# Patient Record
Sex: Male | Born: 1950
Health system: Southern US, Community
[De-identification: ages and names within clinical notes are randomized; demographics above are authoritative.]

## PROBLEM LIST (undated history)

## (undated) DIAGNOSIS — M1990 Unspecified osteoarthritis, unspecified site: Secondary | ICD-10-CM

## (undated) DIAGNOSIS — I10 Essential (primary) hypertension: Secondary | ICD-10-CM

## (undated) DIAGNOSIS — K219 Gastro-esophageal reflux disease without esophagitis: Secondary | ICD-10-CM

## (undated) DIAGNOSIS — E785 Hyperlipidemia, unspecified: Secondary | ICD-10-CM

## (undated) HISTORY — PX: LASIK: SHX215

## (undated) HISTORY — PX: TONSILLECTOMY: SUR1361

## (undated) HISTORY — DX: Essential (primary) hypertension: I10

## (undated) HISTORY — DX: Hyperlipidemia, unspecified: E78.5

## (undated) HISTORY — PX: APPENDECTOMY: SHX54

## (undated) HISTORY — DX: Gastro-esophageal reflux disease without esophagitis: K21.9

## (undated) HISTORY — PX: HEMORRHOID SURGERY: SHX153

---

## 1999-05-24 ENCOUNTER — Emergency Department (HOSPITAL_COMMUNITY): Admission: EM | Admit: 1999-05-24 | Discharge: 1999-05-25 | Payer: Self-pay | Admitting: Emergency Medicine

## 1999-10-02 ENCOUNTER — Emergency Department (HOSPITAL_COMMUNITY): Admission: EM | Admit: 1999-10-02 | Discharge: 1999-10-02 | Payer: Self-pay | Admitting: Emergency Medicine

## 2000-05-10 ENCOUNTER — Emergency Department (HOSPITAL_COMMUNITY): Admission: EM | Admit: 2000-05-10 | Discharge: 2000-05-10 | Payer: Self-pay | Admitting: Emergency Medicine

## 2001-10-10 ENCOUNTER — Emergency Department (HOSPITAL_COMMUNITY): Admission: EM | Admit: 2001-10-10 | Discharge: 2001-10-10 | Payer: Self-pay | Admitting: Emergency Medicine

## 2001-11-14 ENCOUNTER — Emergency Department (HOSPITAL_COMMUNITY): Admission: EM | Admit: 2001-11-14 | Discharge: 2001-11-14 | Payer: Self-pay | Admitting: Emergency Medicine

## 2002-07-02 ENCOUNTER — Emergency Department (HOSPITAL_COMMUNITY): Admission: EM | Admit: 2002-07-02 | Discharge: 2002-07-02 | Payer: Self-pay | Admitting: Emergency Medicine

## 2002-07-05 ENCOUNTER — Emergency Department (HOSPITAL_COMMUNITY): Admission: EM | Admit: 2002-07-05 | Discharge: 2002-07-06 | Payer: Self-pay | Admitting: Emergency Medicine

## 2004-03-15 ENCOUNTER — Emergency Department (HOSPITAL_COMMUNITY): Admission: EM | Admit: 2004-03-15 | Discharge: 2004-03-15 | Payer: Self-pay | Admitting: Emergency Medicine

## 2005-06-13 ENCOUNTER — Inpatient Hospital Stay (HOSPITAL_COMMUNITY): Admission: AD | Admit: 2005-06-13 | Discharge: 2005-06-15 | Payer: Self-pay | Admitting: Internal Medicine

## 2005-06-13 ENCOUNTER — Encounter: Payer: Self-pay | Admitting: Internal Medicine

## 2007-10-12 ENCOUNTER — Emergency Department (HOSPITAL_COMMUNITY): Admission: EM | Admit: 2007-10-12 | Discharge: 2007-10-12 | Payer: Self-pay | Admitting: *Deleted

## 2008-08-05 ENCOUNTER — Emergency Department (HOSPITAL_COMMUNITY): Admission: EM | Admit: 2008-08-05 | Discharge: 2008-08-05 | Payer: Self-pay | Admitting: Emergency Medicine

## 2010-04-28 ENCOUNTER — Emergency Department (HOSPITAL_COMMUNITY): Admission: EM | Admit: 2010-04-28 | Discharge: 2010-04-28 | Payer: Self-pay | Admitting: Emergency Medicine

## 2010-10-02 ENCOUNTER — Emergency Department (HOSPITAL_COMMUNITY): Admission: EM | Admit: 2010-10-02 | Discharge: 2010-10-02 | Payer: Self-pay | Admitting: Emergency Medicine

## 2010-12-04 ENCOUNTER — Emergency Department (HOSPITAL_COMMUNITY)
Admission: EM | Admit: 2010-12-04 | Discharge: 2010-12-04 | Payer: Self-pay | Source: Home / Self Care | Admitting: Emergency Medicine

## 2011-01-04 ENCOUNTER — Telehealth: Payer: Self-pay | Admitting: *Deleted

## 2011-01-04 NOTE — Telephone Encounter (Signed)
Error in opening encounter

## 2011-01-25 ENCOUNTER — Emergency Department (HOSPITAL_COMMUNITY)
Admission: EM | Admit: 2011-01-25 | Discharge: 2011-01-25 | Disposition: A | Discharge status: Home / Self Care | Payer: Managed Care, Other (non HMO) | Attending: Emergency Medicine | Admitting: Emergency Medicine

## 2011-01-25 DIAGNOSIS — Z79899 Other long term (current) drug therapy: Secondary | ICD-10-CM | POA: Insufficient documentation

## 2011-01-25 DIAGNOSIS — K2970 Gastritis, unspecified, without bleeding: Secondary | ICD-10-CM | POA: Insufficient documentation

## 2011-01-25 DIAGNOSIS — R10819 Abdominal tenderness, unspecified site: Secondary | ICD-10-CM | POA: Insufficient documentation

## 2011-01-25 DIAGNOSIS — E119 Type 2 diabetes mellitus without complications: Secondary | ICD-10-CM | POA: Insufficient documentation

## 2011-01-25 DIAGNOSIS — R1033 Periumbilical pain: Secondary | ICD-10-CM | POA: Insufficient documentation

## 2011-01-25 DIAGNOSIS — E7800 Pure hypercholesterolemia, unspecified: Secondary | ICD-10-CM | POA: Insufficient documentation

## 2011-01-25 DIAGNOSIS — I10 Essential (primary) hypertension: Secondary | ICD-10-CM | POA: Insufficient documentation

## 2011-01-25 DIAGNOSIS — Z9889 Other specified postprocedural states: Secondary | ICD-10-CM | POA: Insufficient documentation

## 2011-01-25 LAB — COMPREHENSIVE METABOLIC PANEL
ALT: 23 U/L (ref 0–53)
Albumin: 4.3 g/dL (ref 3.5–5.2)
Alkaline Phosphatase: 61 U/L (ref 39–117)
GFR calc Af Amer: >60 mL/min (ref >60)
GFR calc non Af Amer: >60 mL/min (ref >60)
Glucose, Bld: 162 mg/dL — ABNORMAL HIGH (ref 70–99)
Potassium: 4.5 mEq/L (ref 3.5–5.1)
Sodium: 136 mEq/L (ref 135–145)
Total Bilirubin: 1.1 mg/dL (ref 0.3–1.2)
Total Protein: 7.9 g/dL (ref 6.0–8.3)

## 2011-01-25 LAB — CBC
Hemoglobin: 14.3 g/dL (ref 13.0–17.0)
MCV: 70.9 fL — ABNORMAL LOW (ref 78.0–100.0)
RBC: 6.23 MIL/uL — ABNORMAL HIGH (ref 4.22–5.81)
RDW: 14.2 % (ref 11.5–15.5)

## 2011-01-25 LAB — POCT CARDIAC MARKERS
Myoglobin, poc: 86.5 ng/mL (ref 12–200)
Troponin i, poc: <0.05 ng/mL (ref 0.00–0.09)

## 2011-01-25 LAB — DIFFERENTIAL
Basophils Absolute: 0 10*3/uL (ref 0.0–0.1)
Basophils Relative: 0 % (ref 0–1)
Eosinophils Absolute: 0 10*3/uL (ref 0.0–0.7)
Eosinophils Relative: 0 % (ref 0–5)
Monocytes Absolute: 0.7 10*3/uL (ref 0.1–1.0)
Neutro Abs: 7.1 10*3/uL (ref 1.7–7.7)
Neutrophils Relative %: 76 % (ref 43–77)

## 2011-01-26 ENCOUNTER — Inpatient Hospital Stay (HOSPITAL_COMMUNITY)
Admission: EM | Admit: 2011-01-26 | Discharge: 2011-01-28 | DRG: 392 | Disposition: A | Discharge status: Home / Self Care | Payer: Managed Care, Other (non HMO) | Attending: Family Medicine | Admitting: Family Medicine

## 2011-01-26 DIAGNOSIS — R1033 Periumbilical pain: Secondary | ICD-10-CM | POA: Diagnosis present

## 2011-01-26 DIAGNOSIS — I10 Essential (primary) hypertension: Secondary | ICD-10-CM | POA: Diagnosis present

## 2011-01-26 DIAGNOSIS — E119 Type 2 diabetes mellitus without complications: Secondary | ICD-10-CM | POA: Diagnosis present

## 2011-01-26 DIAGNOSIS — A09 Infectious gastroenteritis and colitis, unspecified: Principal | ICD-10-CM | POA: Diagnosis present

## 2011-01-27 ENCOUNTER — Emergency Department (HOSPITAL_COMMUNITY): Payer: Managed Care, Other (non HMO)

## 2011-01-27 DIAGNOSIS — R933 Abnormal findings on diagnostic imaging of other parts of digestive tract: Secondary | ICD-10-CM

## 2011-01-27 DIAGNOSIS — R109 Unspecified abdominal pain: Secondary | ICD-10-CM

## 2011-01-27 LAB — COMPREHENSIVE METABOLIC PANEL
ALT: 18 U/L (ref 0–53)
ALT: 21 U/L (ref 0–53)
Alkaline Phosphatase: 53 U/L (ref 39–117)
BUN: 13 mg/dL (ref 6–23)
CO2: 27 mEq/L (ref 19–32)
Creatinine, Ser: 1.02 mg/dL (ref 0.4–1.5)
GFR calc Af Amer: >60 mL/min (ref >60)
GFR calc non Af Amer: >60 mL/min (ref >60)
Glucose, Bld: 107 mg/dL — ABNORMAL HIGH (ref 70–99)
Potassium: 4.7 mEq/L (ref 3.5–5.1)
Potassium: 5.1 mEq/L (ref 3.5–5.1)
Sodium: 138 mEq/L (ref 135–145)
Sodium: 137 mEq/L (ref 135–145)
Total Bilirubin: 1.2 mg/dL (ref 0.3–1.2)
Total Protein: 6.4 g/dL (ref 6.0–8.3)

## 2011-01-27 LAB — DIFFERENTIAL
Basophils Absolute: 0 10*3/uL (ref 0.0–0.1)
Basophils Relative: 0 % (ref 0–1)
Eosinophils Absolute: 0.2 10*3/uL (ref 0.0–0.7)
Eosinophils Relative: 3 % (ref 0–5)
Lymphocytes Relative: 25 % (ref 12–46)
Lymphs Abs: 2 10*3/uL (ref 0.7–4.0)
Lymphs Abs: 1.6 10*3/uL (ref 0.7–4.0)
Monocytes Absolute: 0.7 10*3/uL (ref 0.1–1.0)
Monocytes Absolute: 0.5 10*3/uL (ref 0.1–1.0)
Neutro Abs: 4 10*3/uL (ref 1.7–7.7)
Neutro Abs: 4.1 10*3/uL (ref 1.7–7.7)
Neutrophils Relative %: 65 % (ref 43–77)

## 2011-01-27 LAB — TSH: TSH: 1.447 u[IU]/mL (ref 0.350–4.500)

## 2011-01-27 LAB — CBC
HCT: 43.1 % (ref 39.0–52.0)
HCT: 40.9 % (ref 39.0–52.0)
Hemoglobin: 13.3 g/dL (ref 13.0–17.0)
MCH: 22.7 pg — ABNORMAL LOW (ref 26.0–34.0)
MCHC: 30.9 g/dL (ref 30.0–36.0)
Platelets: 239 10*3/uL (ref 150–400)
RBC: 5.85 MIL/uL — ABNORMAL HIGH (ref 4.22–5.81)
RDW: 14.3 % (ref 11.5–15.5)

## 2011-01-27 LAB — GLUCOSE, CAPILLARY
Glucose-Capillary: 137 mg/dL — ABNORMAL HIGH (ref 70–99)
Glucose-Capillary: 96 mg/dL (ref 70–99)

## 2011-01-27 LAB — LACTIC ACID, PLASMA: Lactic Acid, Venous: 1.8 mmol/L (ref 0.5–2.2)

## 2011-01-27 LAB — HEMOGLOBIN A1C
Hgb A1c MFr Bld: 6.4 % — ABNORMAL HIGH (ref <5.7)
Mean Plasma Glucose: 137 mg/dL — ABNORMAL HIGH (ref <117)

## 2011-01-27 MED ORDER — IOHEXOL 300 MG/ML  SOLN
125.0000 mL | Freq: Once | INTRAMUSCULAR | Status: AC | PRN
  Administered 2011-01-27: 125 mL via INTRAVENOUS

## 2011-01-28 ENCOUNTER — Telehealth: Payer: Self-pay | Admitting: Internal Medicine

## 2011-01-28 LAB — COMPREHENSIVE METABOLIC PANEL
Albumin: 3.2 g/dL — ABNORMAL LOW (ref 3.5–5.2)
Alkaline Phosphatase: 46 U/L (ref 39–117)
BUN: 10 mg/dL (ref 6–23)
Calcium: 8.1 mg/dL — ABNORMAL LOW (ref 8.4–10.5)
Glucose, Bld: 138 mg/dL — ABNORMAL HIGH (ref 70–99)
Potassium: 4.5 mEq/L (ref 3.5–5.1)
Total Protein: 6.2 g/dL (ref 6.0–8.3)

## 2011-01-28 LAB — CBC
MCH: 23 pg — ABNORMAL LOW (ref 26.0–34.0)
MCV: 74.3 fL — ABNORMAL LOW (ref 78.0–100.0)
Platelets: 242 10*3/uL (ref 150–400)
RBC: 5.26 MIL/uL (ref 4.22–5.81)
RDW: 14.5 % (ref 11.5–15.5)
WBC: 7.6 10*3/uL (ref 4.0–10.5)

## 2011-01-28 LAB — DIFFERENTIAL
Basophils Relative: 0 % (ref 0–1)
Eosinophils Absolute: 0.1 10*3/uL (ref 0.0–0.7)
Eosinophils Relative: 1 % (ref 0–5)
Monocytes Relative: 9 % (ref 3–12)
Neutrophils Relative %: 78 % — ABNORMAL HIGH (ref 43–77)

## 2011-01-28 LAB — IRON AND TIBC
Iron: 28 ug/dL — ABNORMAL LOW (ref 42–135)
Saturation Ratios: 10 % — ABNORMAL LOW (ref 20–55)
TIBC: 276 ug/dL (ref 215–435)

## 2011-01-28 LAB — GLUCOSE, CAPILLARY
Glucose-Capillary: 135 mg/dL — ABNORMAL HIGH (ref 70–99)
Glucose-Capillary: 156 mg/dL — ABNORMAL HIGH (ref 70–99)

## 2011-01-28 LAB — LIPASE, BLOOD: Lipase: 20 U/L (ref 11–59)

## 2011-01-28 LAB — AMYLASE: Amylase: 64 U/L (ref 0–105)

## 2011-02-02 NOTE — Discharge Summary (Signed)
James [name], [name]                ACCOUNT NO.:  [id number]  MEDICAL RECORD NO.:  [id number]           PATIENT TYPE:  I  LOCATION:  1536                         FACILITY:  [facility]  PHYSICIAN:  James Kaufmann, MD         DATE OF BIRTH:  [birth year]  DATE OF ADMISSION:  01/27/2011 DATE OF DISCHARGE:  01/28/2011                              DISCHARGE SUMMARY   ADMISSION DIAGNOSES: 1. Abdominal pain. 2. Diabetes mellitus. 3. Hypertension.  DISCHARGE DIAGNOSES: 1. Small bowel wall thickening, questionable inflammatory process. 2. Diabetes mellitus. 3. Hypertension.  Tests performed in the hospital include CT of the abdomen and pelvis on January 27, 2011, showed mid small bowel wall thickening and mucosal fold thickening consistent with inflammatory infectious process.  Distal and terminal ileum are normal and no findings for obstruction.  Stable low- attenuation liver lesion.  No abdominal mass lesions or adenopathy.  Pertinent labs in the hospital, hemoglobin A1c was 6.4.  TSH 1.447. Lactic acid was 1.8.  Lipase 22.  BUN 10, creatinine 1.25.  BRIEF HISTORY AND PHYSICAL:  A 60 year old male with a history of diabetes, hypertension, hyperlipidemia, and GERD who came with complaints of abdominal pain which was sharp and constant in the periumbilical area, which was hurting when the patient eats or drinks. The patient was admitted with diagnosis of abdominal pain, rule out inflammatory bowel disease, and GI consult was obtained.  BRIEF HOSPITAL COURSE: 1. The patient was seen by GI and the patient was started on Bentyl     for the cramping and colonoscopy report was obtained, which the     patient had in 2006, which was a normal colonoscopy.  The patient     at this time has improvement of the symptoms and is able to     tolerate the food.  Diet is advanced and as per GI, they will     follow up the patient in the office as outpatient.  At this time,     the patient can be discharged  home.  The patient has an appointment     with Dr. Leone Payor on February 22, 2011. 2. Diabetes mellitus.  The patient will continue to use the metformin. 3. Hypertension.  The patient will continue to use Micardis.  VITALS ON THE DAY OF DISCHARGE:  Blood pressure 113/78, pulse is 80, respirations 18, temperature is 98.3, O2 sats 94% on room air.  The patient had normal white count throughout the hospital stay with a white count on discharge of 7.6.  The patient also had anemia panel drawn.  The labs will be followed by Dr. Marvell Fuller office.  MEDICATIONS ON DISCHARGE: 1. Dicyclomine 10 mg p.o. t.i.d. as needed. 2. Lortab 5/500 on tablet every 8 hours as needed. 3. Aspirin enteric-coated 81 mg p.o. daily. 4. Glipizide XL 10 mg p.o. daily every morning. 5. Metformin XR 750 mg 1 tablet p.o. b.i.d. 6. Micardis 80 mg 1 tablet her p.o. daily. 7. Multivitamin 1 tablet p.o. daily. 8. Pantoprazole 40 mg p.o. daily. 9. Simvastatin 40 mg one p.o. daily.     James Kaufmann, MD  GL/MEDQ  D:  01/28/2011  T:  01/29/2011  Job:  [id number]  cc:   Iva Boop, MD,FACG Shirlean Kelly  Electronically Signed by James [name]  on 02/02/2011 12:07:01 PM

## 2011-02-05 NOTE — Progress Notes (Signed)
Summary: Medication  Phone Note Call from Patient Call back at Home Phone [phone]   Caller: Patient Call For: Mike Gip Reason for Call: Talk to Nurse Summary of Call: Pt. said Amy released him from [facility] today and was given script for Hydrocodone and he is allergic to it.Marland Kitchen..Marland Kitchenneeds an alternative med. [facility] [address] Initial call taken by: Karna Christmas,  January 28, 2011 2:52 PM  Follow-up for Phone Call        Message left for patient to call back. Spoke with pharmacy. Patient did have an rx from the hospitalist. They did not fill it. Jesse Fall, RN 01/28/11 3:27 PM Spoke with patient. He would like the rx for Ultram sent to [facility] on [address]. Rx faxed to them. Follow-up by: Jesse Fall RN,  January 28, 2011 3:56 PM    New/Updated Medications: ULTRAM 50 MG TABS (TRAMADOL HCL) Take one every 6 hours as needed pain Prescriptions: ULTRAM 50 MG TABS (TRAMADOL HCL) Take one every 6 hours as needed pain  #30 x 0   Entered by:   Jesse Fall RN   Authorized by:   Sammuel Cooper PA-c   Signed by:   Jesse Fall RN on 01/28/2011   Method used:   Printed then faxed to ...         RxID:   [id number]

## 2011-02-11 LAB — COMPREHENSIVE METABOLIC PANEL
AST: 22 U/L (ref 0–37)
Albumin: 4.1 g/dL (ref 3.5–5.2)
BUN: 8 mg/dL (ref 6–23)
Calcium: 9.6 mg/dL (ref 8.4–10.5)
Chloride: 104 mEq/L (ref 96–112)
Creatinine, Ser: 0.99 mg/dL (ref 0.4–1.5)
GFR calc Af Amer: >60 mL/min (ref >60)
Total Bilirubin: 0.9 mg/dL (ref 0.3–1.2)

## 2011-02-11 LAB — URINALYSIS, ROUTINE W REFLEX MICROSCOPIC
Bilirubin Urine: NEGATIVE
Glucose, UA: NEGATIVE mg/dL
Hgb urine dipstick: NEGATIVE
Ketones, ur: NEGATIVE mg/dL
Protein, ur: NEGATIVE mg/dL
Urobilinogen, UA: 1 mg/dL (ref 0.0–1.0)

## 2011-02-11 LAB — CBC
HCT: 41.8 % (ref 39.0–52.0)
MCHC: 31.6 g/dL (ref 30.0–36.0)
MCV: 73.6 fL — ABNORMAL LOW (ref 78.0–100.0)
Platelets: 278 10*3/uL (ref 150–400)
RDW: 14.3 % (ref 11.5–15.5)
WBC: 6.7 10*3/uL (ref 4.0–10.5)

## 2011-02-11 LAB — DIFFERENTIAL
Basophils Absolute: 0 10*3/uL (ref 0.0–0.1)
Lymphocytes Relative: 28 % (ref 12–46)
Lymphs Abs: 1.9 10*3/uL (ref 0.7–4.0)
Monocytes Absolute: 0.6 10*3/uL (ref 0.1–1.0)
Neutro Abs: 4.2 10*3/uL (ref 1.7–7.7)

## 2011-02-11 LAB — LIPASE, BLOOD: Lipase: 22 U/L (ref 11–59)

## 2011-02-22 ENCOUNTER — Ambulatory Visit (INDEPENDENT_AMBULATORY_CARE_PROVIDER_SITE_OTHER): Payer: Managed Care, Other (non HMO) | Admitting: Internal Medicine

## 2011-02-22 ENCOUNTER — Other Ambulatory Visit (INDEPENDENT_AMBULATORY_CARE_PROVIDER_SITE_OTHER): Payer: Managed Care, Other (non HMO)

## 2011-02-22 ENCOUNTER — Encounter: Payer: Self-pay | Admitting: Internal Medicine

## 2011-02-22 VITALS — BP 130/90 | HR 88 | Ht 65.0 in | Wt 186.2 lb

## 2011-02-22 DIAGNOSIS — I10 Essential (primary) hypertension: Secondary | ICD-10-CM

## 2011-02-22 DIAGNOSIS — K529 Noninfective gastroenteritis and colitis, unspecified: Secondary | ICD-10-CM

## 2011-02-22 DIAGNOSIS — D649 Anemia, unspecified: Secondary | ICD-10-CM

## 2011-02-22 DIAGNOSIS — E119 Type 2 diabetes mellitus without complications: Secondary | ICD-10-CM

## 2011-02-22 DIAGNOSIS — K5289 Other specified noninfective gastroenteritis and colitis: Secondary | ICD-10-CM

## 2011-02-22 LAB — CBC WITH DIFFERENTIAL/PLATELET
Basophils Relative: 0.3 % (ref 0.0–3.0)
Eosinophils Relative: 3.9 % (ref 0.0–5.0)
HCT: 39.2 % (ref 39.0–52.0)
Hemoglobin: 12.8 g/dL — ABNORMAL LOW (ref 13.0–17.0)
Lymphocytes Relative: 23 % (ref 12.0–46.0)
Monocytes Absolute: 0.6 10*3/uL (ref 0.1–1.0)
Monocytes Relative: 7.9 % (ref 3.0–12.0)
Neutro Abs: 4.8 10*3/uL (ref 1.4–7.7)
RBC: 5.31 Mil/uL (ref 4.22–5.81)
WBC: 7.4 10*3/uL (ref 4.5–10.5)

## 2011-02-22 LAB — FERRITIN: Ferritin: 52 ng/mL (ref 22.0–322.0)

## 2011-02-22 NOTE — Progress Notes (Signed)
60 year old [place] American man being seen in followup after hospitalization. He was admitted to the hospital in early March with nausea vomiting and diarrhea. He had a CT scan with findings as described below.  CT abd/pelvis 01/27/2011 1. Mid small bowel wall thickening and mucosal fold thickening  consistent with an inflammatory or infectious process. The distal  and terminal ileum are normal and no findings for obstruction.  2. Stable low attenuation liver lesion.  3. No abdominal mass lesions or adenopathy.  At the time the impression was of a probable gastroenteritis. He had a similar presentation in 2006 with terminal ileitis and subsequent colonoscopy by her another gastroenterologist was unrevealing at that time. He had abdominal  pain in 2011 and but a CT abdomen and pelvis was unremarkable with respect to the bowel at that time. He feels well now and notes no constipation or diarrhea. He had a mild anemia with microcytosis, his iron saturation was 10% with  TIBC of about 280.  He does not recall a prior diagnosis of anemia.  Physical exam: Well-developed well-nourished no acute distress  Eyes: anicteric Abdomen: is soft and nontender without organomegaly or mass

## 2011-02-22 NOTE — Patient Instructions (Addendum)
He will have a complete blood count, b 12 level and ferritin (iron) level checked today due to your mild anemia detected the hospital. The esophagus will call those results and any further plans. If you develop recurrent abdominal pain nausea and/or vomiting or other bowel changes, please call us back. A routine colonoscopy should be performed in 2016 as part of colon cancer screening. Please go to basement to have your labs today

## 2011-02-22 NOTE — Assessment & Plan Note (Signed)
He has had 2 episodes CT scan, once in 2006 with followup negative colonoscopy including terminal ileum inspection. Then more recently in March of this year 2012, with bowel thickening. There is thickening of the terminal ileum only prior incident. This is most likely separate episodes of a enteritis problem that was probably infectious. At this time we have agreed to observe him up for her current problems. Should he have recurrent problems I think even a capsule endoscopy of the small intestine or a CT or MR enterography would be in order

## 2011-02-22 NOTE — Assessment & Plan Note (Addendum)
Microcytic anemia of unclear etiology. There was a minimal anemia. We'll go ahead and check B12 and ferritin as his iron saturation was low but TIBC was in the mid range. Further plans pending those results. His metformin Sinemet some increased risk of B12 deficiency her that usually not microcytic. 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

## 2011-02-25 NOTE — Progress Notes (Signed)
Quick Note:  Red blood cells are small - they were 1 year ago also B12 ok, iron ok but low normal range His PCP is [facility] the clinic has a satellite at [facility] and he gets his care there Need to see what his CBC's were over past 2-3 years if they can do that - if this is newer problem may require other work-up ? If he has sickle trait or other blood cell abnormality (not aware but ask him) Also cc them on the labs, I believe I faxed the office visit already ______

## 2011-03-01 ENCOUNTER — Telehealth: Payer: Self-pay | Admitting: Internal Medicine

## 2011-03-01 NOTE — Telephone Encounter (Signed)
A patient now u his prior labs and he has small red blood cells on a chronic basis. This should not indicated any health problem as far as I can tell and needs no further workup at this time.

## 2011-03-04 ENCOUNTER — Encounter: Payer: Self-pay | Admitting: Internal Medicine

## 2011-03-04 NOTE — Telephone Encounter (Signed)
Patient advised.

## 2011-03-07 NOTE — H&P (Signed)
NAMECAMRON, [name]                ACCOUNT NO.:  [id number]  MEDICAL RECORD NO.:  [id number]           PATIENT TYPE:  E  LOCATION:  WLED                         FACILITY:  [facility]  PHYSICIAN:  Massie Maroon, MD        DATE OF BIRTH:  Apr 01, 1951  DATE OF ADMISSION:  01/26/2011 DATE OF DISCHARGE:                             HISTORY & PHYSICAL   CHIEF COMPLAINT:  My stomachaches.  HISTORY OF PRESENT ILLNESS:  A 60 year old male with a history of diabetes, hypertension, hyperlipidemia, GERD, presents with complaints of stomachache.  It is a sharp, constant pain in the periumbilical area and it hurts when he eats or drinks worse.  The patient denies any fever, chills, nausea, vomiting, bright red blood per rectum, or black stool.  He did note diarrhea x1 this morning.  He notes that he has had an endoscopy at [facility] about 5 years ago.  The patient was evaluated in the emergency room and a CT scan showed evidence of mid small bowel wall thickening and mucosal fold thickening consistent with an inflammatory infectious process.  The distal and terminal ileum are normal and no findings for obstruction.  Stable low attenuation liver lesion.  No abdominal mass.  The patient will be admitted for rule out of inflammatory bowel disease.  PAST MEDICAL HISTORY: 1. Type 2 diabetes. 2. Hypertension. 3. Hyperlipidemia. 4. GERD/gastritis. 5. Hydrocele.  PAST SURGICAL HISTORY: 1. Hemorrhoidectomy. 2. Appendectomy.  SOCIAL HISTORY:  The patient does not smoke or drink.  He is married and has 4 children.  He works at [employer].  FAMILY HISTORY:  There is no family history of any GI problems such as colon cancer, inflammatory bowel disease.  His mother died at age 33 with CHF.  His father died at age 43 with kidney failure related to diabetes.  ALLERGIES:  HYDROCODONE, PENICILLIN.  MEDICATIONS: 1. Micardis 80 mg p.o. daily. 2. Metformin 750 mg p.o. daily. 3. Glipizide. 4. Simvastatin 40 mg  p.o. q.h.s. 5. Protonix 40 mg p.o. daily.  REVIEW OF SYSTEMS:  The patient had evidence for distal ileitis and associated minimal free peritoneal fluid, infectious or inflammatory in nature, June 14, 2005, on CT scan.  Review of systems is otherwise negative for all 10 organ systems except for pertinent positives stated above.  PHYSICAL EXAMINATION:  VITAL SIGNS:  Temperature 98, pulse 75, blood pressure 131/87, pulse ox 98% on room air. HEENT:  Anicteric, EOMI, no nystagmus, pupils 1.5 mm, symmetric.  Direct consensual near reflexes intact.  Mucous membranes moist. NECK:  No JVD, no bruit, no thyromegaly, no adenopathy. HEART:  Regular rate and rhythm, S1, S2.  No murmurs, gallops, or rubs. LUNGS:  Clear to auscultation bilaterally. ABDOMEN:  Soft, nontender, nondistended.  Positive bowel sounds. EXTREMITIES:  No cyanosis, clubbing, or edema. SKIN:  No rashes. LYMPH NODES:  No adenopathy. NEUROLOGIC:  Nonfocal. GU:  Hydrocele.  LABORATORY DATA:  WBC 6.9, hemoglobin 13.3, platelet count 269, PT 13.6, INR 1.01, PTT 32, lipase 22.  Sodium 138, potassium 4.7, BUN 13, creatinine 0.98.  AST 25, ALT 18, alk phos 57, total bilirubin  1.0.  ASSESSMENT/PLAN: 1. Abdominal pain:  Rule out inflammatory bowel disease.  Please call     GI consult this morning please.  The patient will be started on     Cipro 400 mg IV b.i.d. and Flagyl 500 mg IV t.i.d. 2. Diabetes type 2:  Fingerstick blood sugars q.4 h. and transition to     a.c., h.s., NovoLog sensitive sliding scale. 3. Hypertension:  Micardis 80 mg p.o. daily.     Massie Maroon, MD     JYK/MEDQ  D:  01/27/2011  T:  01/27/2011  Job:  [id number]  cc:   Shirlean Kelly [facility]  Electronically Signed by Pearson Grippe MD on 03/07/2011 12:56:20 AM

## 2011-04-12 NOTE — Discharge Summary (Signed)
James [name]                ACCOUNT NO.:  [id number]   MEDICAL RECORD NO.:  [id number]          PATIENT TYPE:  INP   LOCATION:  5733                         FACILITY:  [facility]   PHYSICIAN:  Sherin Quarry, MD      DATE OF BIRTH:  09-17-1951   DATE OF ADMISSION:  06/13/2005  DATE OF DISCHARGE:  06/15/2005                                 DISCHARGE SUMMARY   HISTORY OF PRESENT ILLNESS:  James [name] is a 60 year old man who has  generally been in very good health.  On June 13, 2005, after work he ate a  sandwich at [employer] and shortly thereafter, i.e. about four or five hours,  developed severe abdominal pain associated with vomiting.  He presented to  the emergency room where he was seen by Dr. Derenda Mis.  There his  temperature was noted to be 99.3, blood pressure 150/100, pulse 82,  respirations were 20. HEENT exam was within normal limits. The chest was  clear. Cardiovascular exam revealed normal S1-S2 without rubs, murmurs or  gallops. The abdominal exam revealed some abdominal distension.  The bowel  sounds were present.  There was mild diffuse tenderness. Neurologic testing  and examination of extremities was normal.  The sodium was 140, potassium  4.0, creatinine 1.2, BUN was 9.  White count was 4500, hemoglobin was 13.6.  Stool for occult blood was negative.  `X-ray studies obtained included a  chest x-ray which showed no active disease.  A KUB of the abdomen showed  evidence of partial small bowel obstruction.   HOSPITAL COURSE:  In light of these findings, the patient was admitted to  the hospital.  He was initially made NPO.  An IV of normal saline at 100  cc/hour was administered.  Initially the plan was to place an NG tube, but  the patient subsequently had a bowel movement and therefore it was decided  not to place the NG tube.  Zofran was administered p.r.n. for nausea.  The  next day, the patient's x-rays looked quite a bit better.  They showed  significant  decrease in small bowel distension and gas with no evidence of  small bowel obstruction.  A mild ileus was identified.  When I saw the  patient on June 14, 2005, I advanced his diet to full liquids and then  subsequently to a bland soft diet.  Because of the somewhat unusual  presentation, I obtained a CT scan of the abdomen and pelvis.  This showed  evidence of distal ileitis, infectious or inflammatory in nature.  There was  no evidence of abscess or obstruction.  By June 15, 2005, the patient felt  well.  He said he had no abdominal pain.  His bowels were functioning  normally.  There is no nausea or vomiting.  At that time, we had a  discussion about the finding of the CT scan.  I told him that I thought it  was important that he have follow-up of this finding to be sure that he got  the best possible advice.  I therefore spoke to Dr. Molly Maduro  Buccini of [facility], and Dr. Matthias Hughs indicated that he agreed with my  recommendations.  He advised me to have the patient call his office on  Monday at [phone] to arrange an appointment/  The patient was given this  instruction.  Dr. Matthias Hughs also told me to advise the patient not to take any  nonsteroidal anti-inflammatory drugs, and I told him this as well. Therefore  on June 15, 2005, the patient was discharged.   DISCHARGE DIAGNOSES:  1.  Acute abdominal pain with evidence of partial small bowel obstruction,      resolved.  2.  Evidence of distally ileitis by CT scan.  3.  Possible alcohol abuse.   PLAN:  The patient was not prescribed any medications.  He was advised to  refrain from using nonsteroidal anti-inflammatory drugs and was advised to  call Dr. Donavan Burnet office on Monday to arrange a follow-up appointment.       SY/MEDQ  D:  06/15/2005  T:  06/15/2005  Job:  [id number]   cc:   Jethro Bastos, M.D.  [address]  [address]  [place]  [place] [address]  Fax: [phone]   Bernette Redbird, M.D.  [address]  [place], [place] [address]  Fax: [phone]

## 2011-04-12 NOTE — H&P (Signed)
James [name], James [name]                ACCOUNT NO.:  [id number]   MEDICAL RECORD NO.:  [id number]          PATIENT TYPE:  EMS   LOCATION:  ED                           FACILITY:  [facility]   PHYSICIAN:  Melissa L. Ladona Ridgel, MD  DATE OF BIRTH:  [birth year]   DATE OF ADMISSION:  06/13/2005  DATE OF DISCHARGE:                                HISTORY & PHYSICAL   CHIEF COMPLAINT:  Abdominal pain times several hours.   PRIMARY CARE PHYSICIAN:  The patient has none at this time.  He has not seen  a physician in many, many years, although in the past he has seen Dr.  Dorothe Pea at [place], but is not established with him.   HISTORY OF PRESENT ILLNESS:  The patient is a 60 year old African-American  male who states that he ate lunch at [employer] around 11 o'clock, went back to  work, and later in the afternoon developed the acute onset of 10/10 central  abdominal pain with associated vomiting x1.  He states the food from [employer]  came back up without any blood.  He went home and decided to try to eat  again around 4 to 5 p.m.  He threw that up, as well.  He states his last  bowel movement was this morning, and he has never had this problem before.   REVIEW OF SYSTEMS:  He has had no fever or chills.  Positive nausea and  vomiting.  No constipation.  Two to three episodes of diarrhea today.  No  weight loss or weight gain.  No melena or hematochezia.  No dysuria.  No  hematuria.  All other review of systems are negative.   PAST MEDICAL HISTORY:  He states none, but he has not seen a physician in  many years.  He denies hypertension or diabetes.   PAST SURGICAL HISTORY:  1.  Appendectomy.  2.  Hemorrhoid surgery.   SOCIAL HISTORY:  He denies tobacco, but does drink about 12 beers per day.  He has never had seizures, shakes, or delirium tremens if he does not drink.  He denies any illicit drug use.  He drives a truck.   FAMILY HISTORY:  Mom and dad are both deceased from heart failure and renal   failure.   ALLERGIES:  PENICILLIN, which caused him to pass out.   MEDICATIONS:  He takes an aspirin a day.  He does not know why, he just was  started on that many years ago when he did see a doctor, and has continued  to do that.   PHYSICAL EXAMINATION:  VITAL SIGNS:  Temperature of 99.3, down to 97.1,  blood pressure 159/104, pulse 82, respirations 20, saturations 98%.  GENERAL:  This is a well-developed, well-nourished, African-American male in  no acute distress.  HEENT:  Pupils equal, round and reactive to light.  Extraocular muscles are  intact.  Mucous membranes are moist.  NECK:  Supple.  There is no JVD, no lymphadenopathy, no carotid bruits.  He  does not have teeth in the upper palate.  CHEST:  Clear to auscultation.  There are  no rales, rhonchi, or wheezes.  CARDIOVASCULAR:  Regular rate and rhythm.  Positive S1 and S2.  No S3, no  S4, no murmurs, rubs, or gallops.  ABDOMEN:  On initial exam, abdomen was distended, nontender, with positive  bowel sounds.  Reexamination after using the bathroom and passing a large  amount of stool shows decreased distention, and again no guarding or  rebound.  There is no evidence for high-pitched bowel sounds, but he does  have quite active bowel sounds in all quadrants.  EXTREMITIES:  No clubbing, cyanosis, or edema.  NEUROLOGIC:  He is awake, alert, and oriented.  Cranial nerves II-XII are  intact.  Power is 5/5.  DTRs are 2+.   PERTINENT LABORATORY VALUES:  White count is 7.5, hemoglobin 15.7,  hematocrit 47.8, platelets of 271.  Sodium is 135, potassium 4.7, chloride  102, CO2 of 24, BUN 11, creatinine 1.1, glucose 161.  Total bilirubin is  1.4.  Amylase is 100, lipase 15.  Abdominal x-ray shows a partial small  bowel obstruction with air fluid levels in the stomach.   ASSESSMENT AND PLAN:  This is a 60 year old African-American male with  partial small bowel obstruction, currently stable.  He is admitted for  conservative  treatment with consultation to surgery if his condition  worsens.  1.  Gastrointestinal.  Partial small bowel obstruction.  Will make him NPO      for now.  Will repeat his acute abdominal series in the morning, and      place an NG tube if the patient vomits or develops any worsening      abdominal distention.  In light of the recent passage of a large amount      of stool with decreased distention, I elected to hold off on placing      that, even thought his x-ray shows a significant amount of air fluid in      the stomach.  2.  Genitourinary.  Will follow up his urinalysis.  3.  Cardiovascular.  He has past medical history for hypertension; however,      his blood pressure is elevated.  We will follow this clinically, and if      it remains elevated, will consider adding an ACE and an echo.  4.  Endocrine.  He has mild hyperglycemia.  Will check a hemoglobin A1C.  5.  Will use SCDs for deep vein thrombosis prophylaxis and ambulation.       MLT/MEDQ  D:  06/13/2005  T:  06/13/2005  Job:  [id number]

## 2011-08-28 LAB — GLUCOSE, CAPILLARY: Glucose-Capillary: 123 — ABNORMAL HIGH

## 2011-09-27 ENCOUNTER — Ambulatory Visit: Payer: Managed Care, Other (non HMO) | Admitting: Internal Medicine

## 2013-06-08 ENCOUNTER — Emergency Department (HOSPITAL_COMMUNITY)
Admission: EM | Admit: 2013-06-08 | Discharge: 2013-06-08 | Disposition: A | Discharge status: Home / Self Care | Payer: Managed Care, Other (non HMO) | Attending: Emergency Medicine | Admitting: Emergency Medicine

## 2013-06-08 ENCOUNTER — Emergency Department (HOSPITAL_COMMUNITY): Payer: Managed Care, Other (non HMO)

## 2013-06-08 ENCOUNTER — Encounter (HOSPITAL_COMMUNITY): Payer: Self-pay | Admitting: Emergency Medicine

## 2013-06-08 DIAGNOSIS — E785 Hyperlipidemia, unspecified: Secondary | ICD-10-CM | POA: Insufficient documentation

## 2013-06-08 DIAGNOSIS — Y9389 Activity, other specified: Secondary | ICD-10-CM | POA: Insufficient documentation

## 2013-06-08 DIAGNOSIS — Z87448 Personal history of other diseases of urinary system: Secondary | ICD-10-CM | POA: Insufficient documentation

## 2013-06-08 DIAGNOSIS — Y92410 Unspecified street and highway as the place of occurrence of the external cause: Secondary | ICD-10-CM | POA: Insufficient documentation

## 2013-06-08 DIAGNOSIS — Z8719 Personal history of other diseases of the digestive system: Secondary | ICD-10-CM | POA: Insufficient documentation

## 2013-06-08 DIAGNOSIS — Z880 Allergy status to penicillin: Secondary | ICD-10-CM | POA: Insufficient documentation

## 2013-06-08 DIAGNOSIS — Z7982 Long term (current) use of aspirin: Secondary | ICD-10-CM | POA: Insufficient documentation

## 2013-06-08 DIAGNOSIS — S139XXA Sprain of joints and ligaments of unspecified parts of neck, initial encounter: Secondary | ICD-10-CM | POA: Insufficient documentation

## 2013-06-08 DIAGNOSIS — Z79899 Other long term (current) drug therapy: Secondary | ICD-10-CM | POA: Insufficient documentation

## 2013-06-08 DIAGNOSIS — S161XXA Strain of muscle, fascia and tendon at neck level, initial encounter: Secondary | ICD-10-CM

## 2013-06-08 DIAGNOSIS — E119 Type 2 diabetes mellitus without complications: Secondary | ICD-10-CM | POA: Insufficient documentation

## 2013-06-08 DIAGNOSIS — I10 Essential (primary) hypertension: Secondary | ICD-10-CM | POA: Insufficient documentation

## 2013-06-08 MED ORDER — IBUPROFEN 800 MG PO TABS: 800.0000 mg | ORAL_TABLET | Freq: Three times a day (TID) | ORAL | Status: DC | PRN

## 2013-06-08 MED ORDER — TRAMADOL HCL 50 MG PO TABS: 50.0000 mg | ORAL_TABLET | Freq: Four times a day (QID) | ORAL | Status: DC | PRN

## 2013-06-08 MED ORDER — KETOROLAC TROMETHAMINE 60 MG/2ML IM SOLN
60.0000 mg | Freq: Once | INTRAMUSCULAR | Status: AC
  Administered 2013-06-08: 60 mg via INTRAMUSCULAR
  Filled 2013-06-08: qty 2
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
## 2013-06-10 NOTE — ED Provider Notes (Signed)
Medical screening examination/treatment/procedure(s) were performed by non-physician practitioner and as supervising physician I was immediately available for consultation/collaboration.  Maxon Kresse R. Briley Bumgarner, MD 06/10/13 2346 

## 2013-09-16 ENCOUNTER — Encounter (HOSPITAL_COMMUNITY): Payer: Self-pay | Admitting: Emergency Medicine

## 2013-09-16 ENCOUNTER — Emergency Department (HOSPITAL_COMMUNITY)
Admission: EM | Admit: 2013-09-16 | Discharge: 2013-09-16 | Disposition: A | Discharge status: Home / Self Care | Payer: Managed Care, Other (non HMO) | Attending: Emergency Medicine | Admitting: Emergency Medicine

## 2013-09-16 DIAGNOSIS — E785 Hyperlipidemia, unspecified: Secondary | ICD-10-CM | POA: Insufficient documentation

## 2013-09-16 DIAGNOSIS — Z880 Allergy status to penicillin: Secondary | ICD-10-CM | POA: Insufficient documentation

## 2013-09-16 DIAGNOSIS — Z7982 Long term (current) use of aspirin: Secondary | ICD-10-CM | POA: Insufficient documentation

## 2013-09-16 DIAGNOSIS — Z8719 Personal history of other diseases of the digestive system: Secondary | ICD-10-CM | POA: Insufficient documentation

## 2013-09-16 DIAGNOSIS — I10 Essential (primary) hypertension: Secondary | ICD-10-CM | POA: Insufficient documentation

## 2013-09-16 DIAGNOSIS — A088 Other specified intestinal infections: Secondary | ICD-10-CM | POA: Insufficient documentation

## 2013-09-16 DIAGNOSIS — Z79899 Other long term (current) drug therapy: Secondary | ICD-10-CM | POA: Insufficient documentation

## 2013-09-16 DIAGNOSIS — A084 Viral intestinal infection, unspecified: Secondary | ICD-10-CM

## 2013-09-16 DIAGNOSIS — Z87448 Personal history of other diseases of urinary system: Secondary | ICD-10-CM | POA: Insufficient documentation

## 2013-09-16 DIAGNOSIS — E119 Type 2 diabetes mellitus without complications: Secondary | ICD-10-CM | POA: Insufficient documentation

## 2013-09-16 LAB — CBC WITH DIFFERENTIAL/PLATELET
Basophils Absolute: 0 10*3/uL (ref 0.0–0.1)
Eosinophils Relative: 0 % (ref 0–5)
HCT: 41.7 % (ref 39.0–52.0)
Lymphocytes Relative: 8 % — ABNORMAL LOW (ref 12–46)
MCV: 71.2 fL — ABNORMAL LOW (ref 78.0–100.0)
Monocytes Relative: 6 % (ref 3–12)
Neutro Abs: 10.6 10*3/uL — ABNORMAL HIGH (ref 1.7–7.7)
RBC: 5.86 MIL/uL — ABNORMAL HIGH (ref 4.22–5.81)
RDW: 14.3 % (ref 11.5–15.5)
WBC: 12.3 10*3/uL — ABNORMAL HIGH (ref 4.0–10.5)

## 2013-09-16 LAB — COMPREHENSIVE METABOLIC PANEL
AST: 25 U/L (ref 0–37)
Albumin: 4.2 g/dL (ref 3.5–5.2)
Alkaline Phosphatase: 73 U/L (ref 39–117)
CO2: 23 mEq/L (ref 19–32)
Chloride: 101 mEq/L (ref 96–112)
GFR calc non Af Amer: 80 mL/min — ABNORMAL LOW (ref >90)
Potassium: 3.8 mEq/L (ref 3.5–5.1)
Total Bilirubin: 0.5 mg/dL (ref 0.3–1.2)

## 2013-09-16 MED ORDER — ONDANSETRON HCL 4 MG/2ML IJ SOLN
4.0000 mg | Freq: Once | INTRAMUSCULAR | Status: AC
  Administered 2013-09-16: 4 mg via INTRAVENOUS
  Filled 2013-09-16: qty 2

## 2013-09-16 MED ORDER — ONDANSETRON HCL 4 MG PO TABS: 4.0000 mg | ORAL_TABLET | Freq: Four times a day (QID) | ORAL | Status: DC

## 2013-09-16 MED ORDER — SODIUM CHLORIDE 0.9 % IV BOLUS (SEPSIS)
1000.0000 mL | Freq: Once | INTRAVENOUS | Status: AC
  Administered 2013-09-16: 1000 mL via INTRAVENOUS

## 2013-09-16 MED ORDER — ONDANSETRON 8 MG PO TBDP
8.0000 mg | ORAL_TABLET | Freq: Once | ORAL | Status: DC
  Filled 2013-09-16: qty 1

## 2013-09-16 NOTE — ED Provider Notes (Signed)
CSN: [id number]     Arrival date & time 09/16/13  2136 History   First MD Initiated Contact with Patient 09/16/13 2201     Chief Complaint  Patient presents with  . Nausea  . Emesis   (Consider location/radiation/quality/duration/timing/severity/associated sxs/prior Treatment) HPI Comments: Patient is a 62 year old male with history of diabetes, hypertension, hyperlipidemia who presents today with nausea, vomiting, diarrhea past 2 hours. He reports that he ate half a sardine and some chicken noodle soup around 6 PM today. He went for a walk and then began to have symptoms. His diarrhea is watery without any blood. He is vomiting stomach contents. Emesis is nonbloody. He drank some milk to attempt to improve his symptoms with no relief. His abdomen is not painful, just sore from vomiting. He does not know of anyone else with these symptoms. He denies any recent illness, fever, chills, shortness of breath, melena, hematochezia. He reports he just had his physical one week ago and his doctor told him he was in great health. The history is provided by the patient. No language interpreter was used.    Past Medical History  Diagnosis Date  . Diabetes mellitus   . Hypertension   . HLD (hyperlipidemia)   . GERD (gastroesophageal reflux disease)   . Hydrocele    Past Surgical History  Procedure Laterality Date  . Appendectomy    . Hemorrhoid surgery     Family History  Problem Relation Age of Onset  . Brain cancer Brother     deceased age 61  . Lung cancer Brother     deceased age 7  . Colon cancer Neg Hx   . Diabetes Mother   . Diabetes Father   . Heart disease Mother     CHF, deceased 57  . Kidney failure Father     deceased 58   History  Substance Use Topics  . Smoking status: Never Smoker   . Smokeless tobacco: Never Used  . Alcohol Use: No    Review of Systems  Constitutional: Negative for fever and chills.  Respiratory: Negative for shortness of breath.    Gastrointestinal: Positive for nausea, vomiting and diarrhea. Negative for abdominal pain and constipation.  All other systems reviewed and are negative.    Allergies  Amoxicillin; Hydrocodone; Penicillins; and Percocet  Home Medications   Current Outpatient Rx  Name  Route  Sig  Dispense  Refill  . aspirin EC 81 MG tablet   Oral   Take 81 mg by mouth daily.         Marland Kitchen glipiZIDE (GLUCOTROL XL) 10 MG 24 hr tablet   Oral   Take 10 mg by mouth daily.         Marland Kitchen ibuprofen (ADVIL,MOTRIN) 800 MG tablet   Oral   Take 1 tablet (800 mg total) by mouth every 8 (eight) hours as needed for pain.   21 tablet   0   . metFORMIN (GLUCOPHAGE-XR) 750 MG 24 hr tablet   Oral   Take 1,500 mg by mouth at bedtime.          . Multiple Vitamin (MULTIVITAMIN WITH MINERALS) TABS   Oral   Take 1 tablet by mouth daily.         . simvastatin (ZOCOR) 40 MG tablet   Oral   Take 40 mg by mouth daily.          Marland Kitchen telmisartan (MICARDIS) 80 MG tablet   Oral   Take 80 mg by mouth  every morning.          . traMADol (ULTRAM) 50 MG tablet   Oral   Take 1 tablet (50 mg total) by mouth every 6 (six) hours as needed for pain.   15 tablet   0    BP 146/85  Pulse 103  Temp(Src) 98.7 F (37.1 C) (Oral)  Resp 20  SpO2 96% Physical Exam  Nursing note and vitals reviewed. Constitutional: He is oriented to person, place, and time. He appears well-developed and well-nourished. No distress.  HENT:  Head: Normocephalic and atraumatic.  Right Ear: External ear normal.  Left Ear: External ear normal.  Nose: Nose normal.  Eyes: Conjunctivae are normal.  Neck: Normal range of motion. No tracheal deviation present.  Cardiovascular: Normal rate, regular rhythm and normal heart sounds.   Pulmonary/Chest: Effort normal and breath sounds normal. No stridor.  Abdominal: Soft. He exhibits no distension. There is no tenderness. There is no rigidity, no rebound and no guarding.  Musculoskeletal: Normal  range of motion.  Neurological: He is alert and oriented to person, place, and time.  Skin: Skin is warm and dry. He is not diaphoretic.  Psychiatric: He has a normal mood and affect. His behavior is normal.    ED Course  Procedures (including critical care time) Labs Review Labs Reviewed  CBC WITH DIFFERENTIAL - Abnormal; Notable for the following:    WBC 12.3 (*)    RBC 5.86 (*)    MCV 71.2 (*)    MCH 23.2 (*)    Neutrophils Relative % 86 (*)    Lymphocytes Relative 8 (*)    Neutro Abs 10.6 (*)    All other components within normal limits  COMPREHENSIVE METABOLIC PANEL - Abnormal; Notable for the following:    Glucose, Bld 169 (*)    GFR calc non Af Amer 80 (*)    All other components within normal limits  LIPASE, BLOOD   Imaging Review No results found.  EKG Interpretation   None       MDM   1. Viral gastroenteritis    Patient with symptoms consistent with viral gastroenteritis.  Vitals are stable, no fever.  No signs of dehydration, tolerating PO fluids > 6 oz.  Lungs are clear.  No focal abdominal pain, no concern for appendicitis, cholecystitis, pancreatitis, ruptured viscus, UTI, kidney stone, or any other abdominal etiology.  Supportive therapy indicated with return if symptoms worsen.  Patient counseled. Discussed case with Dr. Freida Busman who agrees with plan.        Mora Bellman, PA-C 09/17/13 0002

## 2013-09-16 NOTE — ED Notes (Signed)
Per Dahlia Client, [place] pt given a gingerale.

## 2013-09-16 NOTE — ED Notes (Signed)
Pt starting having n/vx4/dx4 one hour after eating soup this evening. Pt reports mild stomach pain. Denies any blood in vomitus.

## 2013-09-18 NOTE — ED Provider Notes (Signed)
Medical screening examination/treatment/procedure(s) were performed by non-physician practitioner and as supervising physician I was immediately available for consultation/collaboration.  Orell Hurtado T Sparsh Callens, MD 09/18/13 2245 

## 2013-11-01 ENCOUNTER — Emergency Department (HOSPITAL_COMMUNITY)
Admission: EM | Admit: 2013-11-01 | Discharge: 2013-11-01 | Disposition: A | Discharge status: Home / Self Care | Payer: Managed Care, Other (non HMO) | Attending: Emergency Medicine | Admitting: Emergency Medicine

## 2013-11-01 ENCOUNTER — Encounter (HOSPITAL_COMMUNITY): Payer: Self-pay | Admitting: Emergency Medicine

## 2013-11-01 DIAGNOSIS — Z8719 Personal history of other diseases of the digestive system: Secondary | ICD-10-CM | POA: Insufficient documentation

## 2013-11-01 DIAGNOSIS — E119 Type 2 diabetes mellitus without complications: Secondary | ICD-10-CM | POA: Insufficient documentation

## 2013-11-01 DIAGNOSIS — B356 Tinea cruris: Secondary | ICD-10-CM | POA: Insufficient documentation

## 2013-11-01 DIAGNOSIS — Z880 Allergy status to penicillin: Secondary | ICD-10-CM | POA: Insufficient documentation

## 2013-11-01 DIAGNOSIS — Z7982 Long term (current) use of aspirin: Secondary | ICD-10-CM | POA: Insufficient documentation

## 2013-11-01 DIAGNOSIS — I10 Essential (primary) hypertension: Secondary | ICD-10-CM | POA: Insufficient documentation

## 2013-11-01 DIAGNOSIS — E785 Hyperlipidemia, unspecified: Secondary | ICD-10-CM | POA: Insufficient documentation

## 2013-11-01 DIAGNOSIS — Z79899 Other long term (current) drug therapy: Secondary | ICD-10-CM | POA: Insufficient documentation

## 2013-11-01 DIAGNOSIS — Z87448 Personal history of other diseases of urinary system: Secondary | ICD-10-CM | POA: Insufficient documentation

## 2013-11-01 MED ORDER — KETOCONAZOLE 2 % EX CREA: 1.0000 "application " | TOPICAL_CREAM | Freq: Every day | CUTANEOUS | Status: DC

## 2013-11-01 NOTE — ED Provider Notes (Signed)
Medical screening examination/treatment/procedure(s) were performed by non-physician practitioner and as supervising physician I was immediately available for consultation/collaboration.  EKG Interpretation   None        Shyhiem Beeney, MD 11/01/13 2322 

## 2013-11-01 NOTE — ED Notes (Signed)
Pt reports dry itchy rash on top of penis. Denies using any new products.

## 2013-11-01 NOTE — ED Provider Notes (Signed)
CSN: [id number]     Arrival date & time 11/01/13  1800 History  This chart was scribed for non-physician practitioner Wylene Simmer, PA-C working with Doug Sou, MD by Valera Castle, ED scribe. This patient was seen in room WTR5/WTR5 and the patient's care was started at 7:32 PM.   Chief Complaint  Patient presents with  . Rash    penis    The history is provided by the patient. No language interpreter was used.   HPI Comments: James [name] is a 62 y.o. male who presents to the Emergency Department complaining of a sudden, dry, intermittently itchy rash over the skin of his penis, onset 1 week ago. He reports that today the rash has gotten worse. He denies any discharge from the rash, and denies any penile discharge. He denies trying any new products. Pt denies any other rashes, and any other associated symptoms. Pt has no pertinent medical history.   PCP - No primary provider on file.  Past Medical History  Diagnosis Date  . Diabetes mellitus   . Hypertension   . HLD (hyperlipidemia)   . GERD (gastroesophageal reflux disease)   . Hydrocele    Past Surgical History  Procedure Laterality Date  . Appendectomy    . Hemorrhoid surgery     Family History  Problem Relation Age of Onset  . Brain cancer Brother     deceased age 24  . Lung cancer Brother     deceased age 7  . Colon cancer Neg Hx   . Diabetes Mother   . Diabetes Father   . Heart disease Mother     CHF, deceased 67  . Kidney failure Father     deceased 43   History  Substance Use Topics  . Smoking status: Never Smoker   . Smokeless tobacco: Never Used  . Alcohol Use: No    Review of Systems  Skin: Positive for rash (top of penis, dry, itchy).    Allergies  Amoxicillin; Hydrocodone; Penicillins; and Percocet  Home Medications   Current Outpatient Rx  Name  Route  Sig  Dispense  Refill  . aspirin EC 81 MG tablet   Oral   Take 81 mg by mouth daily.         Marland Kitchen glipiZIDE (GLUCOTROL XL) 10 MG  24 hr tablet   Oral   Take 10 mg by mouth daily.         Marland Kitchen ibuprofen (ADVIL,MOTRIN) 800 MG tablet   Oral   Take 1 tablet (800 mg total) by mouth every 8 (eight) hours as needed for pain.   21 tablet   0   . metFORMIN (GLUCOPHAGE-XR) 750 MG 24 hr tablet   Oral   Take 1,500 mg by mouth at bedtime.          . Multiple Vitamin (MULTIVITAMIN WITH MINERALS) TABS   Oral   Take 1 tablet by mouth daily.         . ondansetron (ZOFRAN) 4 MG tablet   Oral   Take 1 tablet (4 mg total) by mouth every 6 (six) hours.   12 tablet   0   . simvastatin (ZOCOR) 40 MG tablet   Oral   Take 40 mg by mouth daily.          Marland Kitchen telmisartan (MICARDIS) 80 MG tablet   Oral   Take 80 mg by mouth every morning.          . traMADol (ULTRAM) 50 MG  tablet   Oral   Take 1 tablet (50 mg total) by mouth every 6 (six) hours as needed for pain.   15 tablet   0    Triage Vitals: BP 154/95  Pulse 71  Temp(Src) 98.1 F (36.7 C) (Oral)  Resp 16  SpO2 96%  Physical Exam  Nursing note and vitals reviewed. Constitutional: He is oriented to person, place, and time. He appears well-developed and well-nourished. No distress.  HENT:  Head: Normocephalic and atraumatic.  Eyes: Conjunctivae are normal. No scleral icterus.  Genitourinary: Penis normal. No penile tenderness.  Patient with circular papular rash noted to the base of the penis on the ventral surface, no nits or crabs in pubic hair, no urethral drainage noted, penis uncircumcised.  Musculoskeletal: Normal range of motion. He exhibits no edema and no tenderness.  Neurological: He is alert and oriented to person, place, and time. He exhibits normal muscle tone. Coordination normal.  Skin: Skin is warm and dry. Rash noted. No erythema. No pallor.  Psychiatric: He has a normal mood and affect. His behavior is normal. Judgment and thought content normal.    ED Course  Procedures (including critical care time)  DIAGNOSTIC STUDIES: Oxygen  Saturation is 96% on room air, normal by my interpretation.    COORDINATION OF CARE: 7:37 PM-Discussed treatment plan which includes clinical suspicion of ring worm with pt at bedside and pt agreed to plan. Will give pt topical cream for the rash. Advised pt to f/u if the symptoms increase.   Labs Review Labs Reviewed - No data to display Imaging Review No results found.  EKG Interpretation   None       MDM  Ringworm  Patient here with circular rash to the base of his penis, does not appear to be scabies in nature, patient is not sexually active outside of marriage, history of DM but this does not appear to be cellulitis.  Patient to follow up with PCP if needed.  I personally performed the services described in this documentation, which was scribed in my presence. The recorded information has been reviewed and is accurate.   Izola Price Marisue Humble, [place] 11/01/13 1946

## 2014-08-27 ENCOUNTER — Encounter (HOSPITAL_COMMUNITY): Payer: Self-pay | Admitting: Emergency Medicine

## 2014-08-27 ENCOUNTER — Emergency Department (HOSPITAL_COMMUNITY)
Admission: EM | Admit: 2014-08-27 | Discharge: 2014-08-28 | Disposition: A | Discharge status: Home / Self Care | Payer: BC Managed Care – PPO | Attending: Emergency Medicine | Admitting: Emergency Medicine

## 2014-08-27 DIAGNOSIS — R1033 Periumbilical pain: Secondary | ICD-10-CM | POA: Insufficient documentation

## 2014-08-27 DIAGNOSIS — Z79899 Other long term (current) drug therapy: Secondary | ICD-10-CM | POA: Insufficient documentation

## 2014-08-27 DIAGNOSIS — E119 Type 2 diabetes mellitus without complications: Secondary | ICD-10-CM | POA: Diagnosis not present

## 2014-08-27 DIAGNOSIS — Z87448 Personal history of other diseases of urinary system: Secondary | ICD-10-CM | POA: Insufficient documentation

## 2014-08-27 DIAGNOSIS — Z7982 Long term (current) use of aspirin: Secondary | ICD-10-CM | POA: Diagnosis not present

## 2014-08-27 DIAGNOSIS — R109 Unspecified abdominal pain: Secondary | ICD-10-CM

## 2014-08-27 DIAGNOSIS — Z9889 Other specified postprocedural states: Secondary | ICD-10-CM | POA: Insufficient documentation

## 2014-08-27 DIAGNOSIS — I10 Essential (primary) hypertension: Secondary | ICD-10-CM | POA: Insufficient documentation

## 2014-08-27 DIAGNOSIS — Z880 Allergy status to penicillin: Secondary | ICD-10-CM | POA: Diagnosis not present

## 2014-08-27 DIAGNOSIS — Z8719 Personal history of other diseases of the digestive system: Secondary | ICD-10-CM | POA: Diagnosis not present

## 2014-08-27 DIAGNOSIS — E785 Hyperlipidemia, unspecified: Secondary | ICD-10-CM | POA: Insufficient documentation

## 2014-08-27 LAB — URINALYSIS, ROUTINE W REFLEX MICROSCOPIC
Bilirubin Urine: NEGATIVE
GLUCOSE, UA: NEGATIVE mg/dL
Hgb urine dipstick: NEGATIVE
Ketones, ur: NEGATIVE mg/dL
LEUKOCYTES UA: NEGATIVE
NITRITE: NEGATIVE
PH: 6 (ref 5.0–8.0)
PROTEIN: NEGATIVE mg/dL
Specific Gravity, Urine: 1.018 (ref 1.005–1.030)
Urobilinogen, UA: 1 mg/dL (ref 0.0–1.0)

## 2014-08-27 LAB — I-STAT CHEM 8, ED
BUN: 18 mg/dL (ref 6–23)
CHLORIDE: 104 meq/L (ref 96–112)
Calcium, Ion: 1.12 mmol/L — ABNORMAL LOW (ref 1.13–1.30)
Creatinine, Ser: 0.9 mg/dL (ref 0.50–1.35)
Glucose, Bld: 120 mg/dL — ABNORMAL HIGH (ref 70–99)
HEMATOCRIT: 46 % (ref 39.0–52.0)
Hemoglobin: 15.6 g/dL (ref 13.0–17.0)
Potassium: 4.9 mEq/L (ref 3.7–5.3)
SODIUM: 141 meq/L (ref 137–147)
TCO2: 27 mmol/L (ref 0–100)

## 2014-08-27 MED ORDER — ONDANSETRON HCL 4 MG/2ML IJ SOLN
4.0000 mg | Freq: Once | INTRAMUSCULAR | Status: AC
  Administered 2014-08-27: 4 mg via INTRAVENOUS
  Filled 2014-08-27: qty 2

## 2014-08-27 MED ORDER — KETOROLAC TROMETHAMINE 30 MG/ML IJ SOLN
30.0000 mg | Freq: Once | INTRAMUSCULAR | Status: AC
  Administered 2014-08-27: 30 mg via INTRAVENOUS
  Filled 2014-08-27: qty 1

## 2014-08-27 NOTE — ED Notes (Signed)
Pt presents with c/o abdominal pain that started tonight. Pt reports no nausea or diarrhea but says that he did vomit one time. Pt reports the pain is in the middle of his abdomen.

## 2014-08-27 NOTE — ED Provider Notes (Signed)
CSN: [id number]     Arrival date & time 08/27/14  2221 History   First MD Initiated Contact with Patient 08/27/14 2305     Chief Complaint  Patient presents with  . Abdominal Pain     (Consider location/radiation/quality/duration/timing/severity/associated sxs/prior Treatment) Patient is a 63 y.o. male presenting with abdominal pain. The history is provided by the patient.  Abdominal Pain Pain location:  Periumbilical Pain quality: cramping   Pain radiates to:  Does not radiate Pain severity:  Severe Onset quality:  Sudden Timing:  Intermittent Progression:  Unchanged Chronicity:  New Context: eating   Context comment:  Took the topping of pizza-- cheese and pepperoni Relieved by:  Nothing Worsened by:  Nothing tried Ineffective treatments:  None tried Associated symptoms: no anorexia, no constipation, no diarrhea and no fever   Risk factors: no alcohol abuse     Past Medical History  Diagnosis Date  . Diabetes mellitus   . Hypertension   . HLD (hyperlipidemia)   . GERD (gastroesophageal reflux disease)   . Hydrocele    Past Surgical History  Procedure Laterality Date  . Appendectomy    . Hemorrhoid surgery     Family History  Problem Relation Age of Onset  . Brain cancer Brother     deceased age 27  . Lung cancer Brother     deceased age 67  . Colon cancer Neg Hx   . Diabetes Mother   . Diabetes Father   . Heart disease Mother     CHF, deceased 7  . Kidney failure Father     deceased 24   History  Substance Use Topics  . Smoking status: Never Smoker   . Smokeless tobacco: Never Used  . Alcohol Use: No    Review of Systems  Constitutional: Negative for fever.  Gastrointestinal: Positive for abdominal pain. Negative for diarrhea, constipation, abdominal distention and anorexia.  All other systems reviewed and are negative.     Allergies  Amoxicillin; Hydrocodone; Penicillins; and Percocet  Home Medications   Prior to Admission medications    Medication Sig Start Date End Date Taking? Authorizing Provider  aspirin EC 81 MG tablet Take 81 mg by mouth daily.   Yes Historical Provider, MD  glipiZIDE (GLUCOTROL XL) 10 MG 24 hr tablet Take 10 mg by mouth daily.   Yes Historical Provider, MD  metFORMIN (GLUCOPHAGE-XR) 750 MG 24 hr tablet Take 1,500 mg by mouth at bedtime.    Yes Historical Provider, MD  Multiple Vitamin (MULTIVITAMIN WITH MINERALS) TABS Take 1 tablet by mouth daily.   Yes Historical Provider, MD  simvastatin (ZOCOR) 40 MG tablet Take 40 mg by mouth daily.    Yes Historical Provider, MD  telmisartan (MICARDIS) 80 MG tablet Take 80 mg by mouth every morning.    Yes Historical Provider, MD   BP 131/88  Pulse 86  Temp(Src) 98.1 F (36.7 C) (Oral)  Resp 16  SpO2 99% Physical Exam  Constitutional: He is oriented to person, place, and time. He appears well-developed and well-nourished.  HENT:  Head: Normocephalic and atraumatic.  Mouth/Throat: Oropharynx is clear and moist.  Eyes: Conjunctivae are normal. Pupils are equal, round, and reactive to light.  Neck: Normal range of motion. Neck supple.  Cardiovascular: Normal rate, regular rhythm and intact distal pulses.   Pulmonary/Chest: Effort normal and breath sounds normal. He has no wheezes. He has no rales.  Abdominal: Soft. Bowel sounds are increased. There is no tenderness. There is no rebound and no  guarding.  Musculoskeletal: Normal range of motion.  Neurological: He is alert and oriented to person, place, and time. He has normal reflexes.  Skin: Skin is warm and dry.  Psychiatric: He has a normal mood and affect.    ED Course  Procedures (including critical care time) Labs Review Labs Reviewed  CBC WITH DIFFERENTIAL  URINALYSIS, ROUTINE W REFLEX MICROSCOPIC  LIPASE, BLOOD  HEPATIC FUNCTION PANEL  I-STAT CHEM 8, ED    Imaging Review No results found.   EKG Interpretation None      MDM   Final diagnoses:  None   Abdominal cramping feels  markedly better post medication.  Bland diet and strict return precautions given   Remedy Corporan K Salar Molden-Rasch, MD 08/28/14 [phone]

## 2014-08-28 ENCOUNTER — Emergency Department (HOSPITAL_COMMUNITY): Payer: BC Managed Care – PPO

## 2014-08-28 ENCOUNTER — Encounter (HOSPITAL_COMMUNITY): Payer: Self-pay | Admitting: Emergency Medicine

## 2014-08-28 LAB — CBC WITH DIFFERENTIAL/PLATELET
BASOS PCT: 0 % (ref 0–1)
Basophils Absolute: 0 10*3/uL (ref 0.0–0.1)
EOS PCT: 3 % (ref 0–5)
Eosinophils Absolute: 0.1 10*3/uL (ref 0.0–0.7)
HCT: 40.5 % (ref 39.0–52.0)
HEMOGLOBIN: 13.1 g/dL (ref 13.0–17.0)
LYMPHS PCT: 37 % (ref 12–46)
Lymphs Abs: 1.7 10*3/uL (ref 0.7–4.0)
MCH: 22.9 pg — ABNORMAL LOW (ref 26.0–34.0)
MCHC: 32.3 g/dL (ref 30.0–36.0)
MCV: 70.8 fL — ABNORMAL LOW (ref 78.0–100.0)
MONOS PCT: 10 % (ref 3–12)
Monocytes Absolute: 0.5 10*3/uL (ref 0.1–1.0)
NEUTROS PCT: 50 % (ref 43–77)
Neutro Abs: 2.4 10*3/uL (ref 1.7–7.7)
Platelets: 293 10*3/uL (ref 150–400)
RBC: 5.72 MIL/uL (ref 4.22–5.81)
RDW: 14.3 % (ref 11.5–15.5)
WBC: 4.7 10*3/uL (ref 4.0–10.5)

## 2014-08-28 LAB — HEPATIC FUNCTION PANEL
ALBUMIN: 3.9 g/dL (ref 3.5–5.2)
ALT: 20 U/L (ref 0–53)
AST: 26 U/L (ref 0–37)
Alkaline Phosphatase: 65 U/L (ref 39–117)
BILIRUBIN TOTAL: 0.7 mg/dL (ref 0.3–1.2)
Total Protein: 7.5 g/dL (ref 6.0–8.3)

## 2014-08-28 LAB — LIPASE, BLOOD: LIPASE: 26 U/L (ref 11–59)

## 2014-08-28 MED ORDER — OMEPRAZOLE 20 MG PO CPDR: 20.0000 mg | DELAYED_RELEASE_CAPSULE | Freq: Every day | ORAL | Status: DC

## 2015-09-04 ENCOUNTER — Emergency Department (HOSPITAL_COMMUNITY)
Admission: EM | Admit: 2015-09-04 | Discharge: 2015-09-05 | Disposition: A | Discharge status: Home / Self Care | Payer: BLUE CROSS/BLUE SHIELD | Attending: Emergency Medicine | Admitting: Emergency Medicine

## 2015-09-04 ENCOUNTER — Emergency Department (HOSPITAL_COMMUNITY): Payer: BLUE CROSS/BLUE SHIELD

## 2015-09-04 ENCOUNTER — Encounter (HOSPITAL_COMMUNITY): Payer: Self-pay | Admitting: *Deleted

## 2015-09-04 DIAGNOSIS — E785 Hyperlipidemia, unspecified: Secondary | ICD-10-CM | POA: Insufficient documentation

## 2015-09-04 DIAGNOSIS — K529 Noninfective gastroenteritis and colitis, unspecified: Secondary | ICD-10-CM | POA: Diagnosis not present

## 2015-09-04 DIAGNOSIS — Z87438 Personal history of other diseases of male genital organs: Secondary | ICD-10-CM | POA: Diagnosis not present

## 2015-09-04 DIAGNOSIS — E119 Type 2 diabetes mellitus without complications: Secondary | ICD-10-CM | POA: Insufficient documentation

## 2015-09-04 DIAGNOSIS — I10 Essential (primary) hypertension: Secondary | ICD-10-CM | POA: Diagnosis not present

## 2015-09-04 DIAGNOSIS — Z880 Allergy status to penicillin: Secondary | ICD-10-CM | POA: Insufficient documentation

## 2015-09-04 DIAGNOSIS — Z79899 Other long term (current) drug therapy: Secondary | ICD-10-CM | POA: Insufficient documentation

## 2015-09-04 DIAGNOSIS — K219 Gastro-esophageal reflux disease without esophagitis: Secondary | ICD-10-CM | POA: Diagnosis not present

## 2015-09-04 DIAGNOSIS — R1084 Generalized abdominal pain: Secondary | ICD-10-CM | POA: Diagnosis present

## 2015-09-04 DIAGNOSIS — Z7982 Long term (current) use of aspirin: Secondary | ICD-10-CM | POA: Insufficient documentation

## 2015-09-04 LAB — I-STAT CHEM 8, ED
BUN: 16 mg/dL (ref 6–20)
Calcium, Ion: 1.25 mmol/L (ref 1.13–1.30)
Chloride: 102 mmol/L (ref 101–111)
Creatinine, Ser: 1 mg/dL (ref 0.61–1.24)
Glucose, Bld: 178 mg/dL — ABNORMAL HIGH (ref 65–99)
HEMATOCRIT: 52 % (ref 39.0–52.0)
HEMOGLOBIN: 17.7 g/dL — AB (ref 13.0–17.0)
POTASSIUM: 4.4 mmol/L (ref 3.5–5.1)
Sodium: 142 mmol/L (ref 135–145)
TCO2: 29 mmol/L (ref 0–100)

## 2015-09-04 LAB — CBC
HCT: 45.2 % (ref 39.0–52.0)
Hemoglobin: 14.5 g/dL (ref 13.0–17.0)
MCH: 23.5 pg — ABNORMAL LOW (ref 26.0–34.0)
MCHC: 32.1 g/dL (ref 30.0–36.0)
MCV: 73.1 fL — ABNORMAL LOW (ref 78.0–100.0)
PLATELETS: 244 10*3/uL (ref 150–400)
RBC: 6.18 MIL/uL — AB (ref 4.22–5.81)
RDW: 14.5 % (ref 11.5–15.5)
WBC: 12.5 10*3/uL — AB (ref 4.0–10.5)

## 2015-09-04 MED ORDER — ONDANSETRON HCL 4 MG/2ML IJ SOLN
4.0000 mg | Freq: Once | INTRAMUSCULAR | Status: AC | PRN
  Administered 2015-09-05: 4 mg via INTRAVENOUS
  Filled 2015-09-04: qty 2

## 2015-09-04 MED ORDER — GI COCKTAIL ~~LOC~~
30.0000 mL | Freq: Once | ORAL | Status: AC
  Administered 2015-09-05: 30 mL via ORAL
  Filled 2015-09-04: qty 30

## 2015-09-04 MED ORDER — KETOROLAC TROMETHAMINE 30 MG/ML IJ SOLN
30.0000 mg | Freq: Once | INTRAMUSCULAR | Status: AC
  Administered 2015-09-05: 30 mg via INTRAVENOUS
  Filled 2015-09-04: qty 1

## 2015-09-04 MED ORDER — DICYCLOMINE HCL 10 MG/ML IM SOLN
20.0000 mg | Freq: Once | INTRAMUSCULAR | Status: AC
  Administered 2015-09-05: 20 mg via INTRAMUSCULAR
  Filled 2015-09-04: qty 2

## 2015-09-04 MED ORDER — SODIUM CHLORIDE 0.9 % IV BOLUS (SEPSIS)
1000.0000 mL | Freq: Once | INTRAVENOUS | Status: AC
  Administered 2015-09-05: 1000 mL via INTRAVENOUS

## 2015-09-04 NOTE — ED Notes (Signed)
Unable to collect labs at this time patient is in the bathroom.

## 2015-09-04 NOTE — ED Notes (Signed)
Delay in lab draw, pt still in bathroom

## 2015-09-04 NOTE — ED Notes (Signed)
Delay in lab draw pt enroute to bathroom. 

## 2015-09-04 NOTE — ED Notes (Signed)
Pt arrives to the ER via EMS after sudden onset of N/V/D and abd pain and distension; pt states that he ate [place] Sausages and Sardines around 6pm and had a sudden onset around 2130; pt c/o abd distension and rigidity; pt with generalized abd pain; upon EMS arrival pt had a watery loose stool

## 2015-09-04 NOTE — ED Provider Notes (Signed)
CSN: [id number]     Arrival date & time 09/04/15  2223 History  By signing my name below, I, Freida Busman, attest that this documentation has been prepared under the direction and in the presence of Patsie Mccardle, MD . Electronically Signed: Freida Busman, Scribe. 09/05/2015. 12:06 AM.   Chief Complaint  Patient presents with  . Abdominal Pain  . Emesis   Patient is a 64 y.o. male presenting with abdominal pain and vomiting. The history is provided by the patient. No language interpreter was used.  Abdominal Pain Pain location:  Generalized Pain quality: cramping   Pain radiates to:  Does not radiate Pain severity:  Moderate Timing:  Constant Progression:  Unchanged Context: eating   Context: not alcohol use   Relieved by:  Nothing Worsened by:  Nothing tried Ineffective treatments:  None tried Associated symptoms: diarrhea, nausea and vomiting   Associated symptoms: no chest pain, no cough and no fever   Nausea:    Severity:  Moderate   Onset quality:  Sudden   Timing:  Constant   Progression:  Unchanged Risk factors: no alcohol abuse   Emesis Associated symptoms: abdominal pain and diarrhea      HPI Comments:  James [name] is a 64 y.o. male with a history of DM and GERD, who presents to the Emergency Department complaining of sudden onset nausea and vomiting (>5 episodes) that began this evening. He reports associated diffuse abdominal pain, described as cramping, and diarrhea (1 episode). Pt reports eating vienna sausages and sardines today and symptoms started after eating the sardines. He was seen in the ED ~ 1 year ago for similar episode after eating pepperoni pizza .  No alleviating factors noted. Pt denies cough, congestion, and fever.He also notes he has not been taking his Prilosec.   Past Medical History  Diagnosis Date  . Diabetes mellitus   . Hypertension   . HLD (hyperlipidemia)   . GERD (gastroesophageal reflux disease)   . Hydrocele    Past Surgical  History  Procedure Laterality Date  . Appendectomy    . Hemorrhoid surgery     Family History  Problem Relation Age of Onset  . Brain cancer Brother     deceased age 74  . Lung cancer Brother     deceased age 59  . Colon cancer Neg Hx   . Diabetes Mother   . Diabetes Father   . Heart disease Mother     CHF, deceased 17  . Kidney failure Father     deceased 28   Social History  Substance Use Topics  . Smoking status: Never Smoker   . Smokeless tobacco: Never Used  . Alcohol Use: No    Review of Systems  Constitutional: Negative for fever.  HENT: Negative for congestion.   Respiratory: Negative for cough.   Cardiovascular: Negative for chest pain.  Gastrointestinal: Positive for nausea, vomiting, abdominal pain and diarrhea.  All other systems reviewed and are negative.   Allergies  Amoxicillin; Hydrocodone; Penicillins; and Percocet  Home Medications   Prior to Admission medications   Medication Sig Start Date End Date Taking? Authorizing Provider  aspirin EC 81 MG tablet Take 81 mg by mouth daily.   Yes Historical Provider, MD  glipiZIDE (GLUCOTROL XL) 10 MG 24 hr tablet Take 10 mg by mouth daily.   Yes Historical Provider, MD  metFORMIN (GLUCOPHAGE-XR) 750 MG 24 hr tablet Take 1,500 mg by mouth at bedtime.    Yes Historical Provider, MD  Multiple Vitamin (MULTIVITAMIN WITH MINERALS) TABS Take 1 tablet by mouth daily.   Yes Historical Provider, MD  omeprazole (PRILOSEC) 20 MG capsule Take 1 capsule (20 mg total) by mouth daily. 08/28/14  Yes Tajee Savant, MD  simvastatin (ZOCOR) 40 MG tablet Take 40 mg by mouth daily.    Yes Historical Provider, MD  telmisartan (MICARDIS) 80 MG tablet Take 80 mg by mouth every morning.    Yes Historical Provider, MD   BP 119/67 mmHg  Pulse 100  Temp(Src) 98.6 F (37 C) (Oral)  Resp 18  SpO2 100% Physical Exam  Constitutional: He is oriented to person, place, and time. He appears well-developed and well-nourished. No distress.   HENT:  Head: Normocephalic and atraumatic.  Mouth/Throat: Oropharynx is clear and moist. No oropharyngeal exudate.  Moist mucous membranes   Eyes: Conjunctivae are normal. Pupils are equal, round, and reactive to light.  Neck: Neck supple.  Trachea midline  Cardiovascular: Normal rate, regular rhythm and normal heart sounds.   Pulmonary/Chest: Effort normal and breath sounds normal. No respiratory distress.  Abdominal: Soft. Bowel sounds are normal. He exhibits no distension. There is no tenderness. There is no rebound and no guarding.  Excessively gassy  Diffuse cramping   Musculoskeletal: Normal range of motion. He exhibits no edema or tenderness.  Neurological: He is alert and oriented to person, place, and time. He has normal reflexes.  Skin: Skin is warm and dry.  Psychiatric: He has a normal mood and affect. His behavior is normal.  Nursing note and vitals reviewed.   ED Course  Procedures   DIAGNOSTIC STUDIES:  Oxygen Saturation is 100% on RA, normal by my interpretation.    COORDINATION OF CARE:  11:30 PM Discussed treatment plan with pt at bedside and pt agreed to plan.  Labs Review Labs Reviewed  COMPREHENSIVE METABOLIC PANEL - Abnormal; Notable for the following:    Glucose, Bld 180 (*)    Total Protein 8.3 (*)    All other components within normal limits  CBC - Abnormal; Notable for the following:    WBC 12.5 (*)    RBC 6.18 (*)    MCV 73.1 (*)    MCH 23.5 (*)    All other components within normal limits  I-STAT CHEM 8, ED - Abnormal; Notable for the following:    Glucose, Bld 178 (*)    Hemoglobin 17.7 (*)    All other components within normal limits  LIPASE, BLOOD  URINALYSIS, ROUTINE W REFLEX MICROSCOPIC (NOT AT [facility])    Imaging Review Dg Abd Acute W/chest  09/05/2015   CLINICAL DATA:  64 year old male with sudden onset nausea vomiting diarrhea and abdominal pain and distension. Symptoms since 20/1 30 hrs. Initial encounter.  EXAM: DG ABDOMEN  ACUTE W/ 1V CHEST  COMPARISON:  08/28/2014 and earlier.  FINDINGS: Stable low lung volumes with mild elevation of the right hemidiaphragm. Normal cardiac size and mediastinal contours. No acute pulmonary opacity. No pneumothorax or pneumoperitoneum.  Mild air in fluid distension of the stomach. Paucity of bowel gas elsewhere with no dilated loops identified. Abdominal and pelvic visceral contours are within normal limits. Small pelvic phleboliths. Stable visualized osseous structures.  IMPRESSION: 1.  Normal bowel gas pattern, no free air. 2.  No acute cardiopulmonary abnormality.   Electronically Signed   By: Odessa Fleming M.D.   On: 09/05/2015 00:36   I have personally reviewed and evaluated these lab results as part of my medical decision-making.   EKG Interpretation None  MDM   Final diagnoses:  None    Results for orders placed or performed during the hospital encounter of 09/04/15  Lipase, blood  Result Value Ref Range   Lipase 22 22 - 51 U/L  Comprehensive metabolic panel  Result Value Ref Range   Sodium 140 135 - 145 mmol/L   Potassium 4.4 3.5 - 5.1 mmol/L   Chloride 103 101 - 111 mmol/L   CO2 29 22 - 32 mmol/L   Glucose, Bld 180 (H) 65 - 99 mg/dL   BUN 13 6 - 20 mg/dL   Creatinine, Ser 1.61 0.61 - 1.24 mg/dL   Calcium 9.1 8.9 - 09.6 mg/dL   Total Protein 8.3 (H) 6.5 - 8.1 g/dL   Albumin 4.5 3.5 - 5.0 g/dL   AST 30 15 - 41 U/L   ALT 24 17 - 63 U/L   Alkaline Phosphatase 70 38 - 126 U/L   Total Bilirubin 1.0 0.3 - 1.2 mg/dL   GFR calc non Af Amer >60 >60 mL/min   GFR calc Af Amer >60 >60 mL/min   Anion gap 8 5 - 15  CBC  Result Value Ref Range   WBC 12.5 (H) 4.0 - 10.5 K/uL   RBC 6.18 (H) 4.22 - 5.81 MIL/uL   Hemoglobin 14.5 13.0 - 17.0 g/dL   HCT 04.5 40.9 - 81.1 %   MCV 73.1 (L) 78.0 - 100.0 fL   MCH 23.5 (L) 26.0 - 34.0 pg   MCHC 32.1 30.0 - 36.0 g/dL   RDW 91.4 78.2 - 95.6 %   Platelets 244 150 - 400 K/uL  I-Stat Chem 8, ED  Result Value Ref Range   Sodium  142 135 - 145 mmol/L   Potassium 4.4 3.5 - 5.1 mmol/L   Chloride 102 101 - 111 mmol/L   BUN 16 6 - 20 mg/dL   Creatinine, Ser 2.13 0.61 - 1.24 mg/dL   Glucose, Bld 086 (H) 65 - 99 mg/dL   Calcium, Ion 5.78 4.69 - 1.30 mmol/L   TCO2 29 0 - 100 mmol/L   Hemoglobin 17.7 (H) 13.0 - 17.0 g/dL   HCT 62.9 52.8 - 41.3 %   Ct Abdomen Pelvis W Contrast  09/05/2015   CLINICAL DATA:  64 year old male with abdominal pain since 2100 hrs. Initial encounter.  EXAM: CT ABDOMEN AND PELVIS WITH CONTRAST  TECHNIQUE: Multidetector CT imaging of the abdomen and pelvis was performed using the standard protocol following bolus administration of intravenous contrast.  CONTRAST:  OMNIPAQUE IOHEXOL 300 MG/ML  SOLN  COMPARISON:  Acute abdominal series 0023 hr today, and earlier including CT Abdomen and Pelvis 01/27/2011.  FINDINGS: Lower lung volumes with mild crowding and dependent atelectasis. No pericardial or pleural effusion.  Degenerative changes in the spine. No acute osseous abnormality identified.  No pelvic free fluid. Diminutive bladder. Fluid in the distal colon. Rectum is otherwise unremarkable. Occasional sigmoid diverticula, no active inflammation. Fluid in the left colon and transverse colon. Fluid in the right colon. Negative appendix. Oral contrast has just reached the terminal ileum and proximal cecum. The stomach is moderately distended with contrast. No dilated or abnormal small bowel loops identified.  Right hepatic lobe low-density cyst or hemangioma unchanged. Negative liver, gallbladder, spleen, pancreas and adrenal glands. No abdominal free fluid or free air. Portal venous system is patent. Aortoiliac calcified atherosclerosis noted. Major arterial structures are patent.  Chronic nonspecific perinephric stranding has mildly progressed compared to 2012. Renal enhancement and contrast excretion within normal limits.  No lymphadenopathy.  IMPRESSION: 1. Fluid throughout the colon in keeping with  diarrhea. 2. Otherwise no acute findings in the abdomen or pelvis.   Electronically Signed   By: Odessa Fleming M.D.   On: 09/05/2015 02:56   Dg Abd Acute W/chest  09/05/2015   CLINICAL DATA:  64 year old male with sudden onset nausea vomiting diarrhea and abdominal pain and distension. Symptoms since 20/1 30 hrs. Initial encounter.  EXAM: DG ABDOMEN ACUTE W/ 1V CHEST  COMPARISON:  08/28/2014 and earlier.  FINDINGS: Stable low lung volumes with mild elevation of the right hemidiaphragm. Normal cardiac size and mediastinal contours. No acute pulmonary opacity. No pneumothorax or pneumoperitoneum.  Mild air in fluid distension of the stomach. Paucity of bowel gas elsewhere with no dilated loops identified. Abdominal and pelvic visceral contours are within normal limits. Small pelvic phleboliths. Stable visualized osseous structures.  IMPRESSION: 1.  Normal bowel gas pattern, no free air. 2.  No acute cardiopulmonary abnormality.   Electronically Signed   By: Odessa Fleming M.D.   On: 09/05/2015 00:36    Medications  ondansetron (ZOFRAN) injection 4 mg (4 mg Intravenous Given 09/05/15 0005)  ketorolac (TORADOL) 30 MG/ML injection 30 mg (30 mg Intravenous Given 09/05/15 0004)  sodium chloride 0.9 % bolus 1,000 mL (0 mLs Intravenous Stopped 09/05/15 0123)  dicyclomine (BENTYL) injection 20 mg (20 mg Intramuscular Given 09/05/15 0005)  gi cocktail (Maalox,Lidocaine,Donnatal) (30 mLs Oral Given 09/05/15 0004)  morphine 4 MG/ML injection 4 mg (4 mg Intravenous Given 09/05/15 0122)  iohexol (OMNIPAQUE) 300 MG/ML solution 50 mL (50 mLs Oral Contrast Given 09/05/15 0147)  iohexol (OMNIPAQUE) 300 MG/ML solution 100 mL (100 mLs Intravenous Contrast Given 09/05/15 0236)     I, Donne Robillard-RASCH,Delores Thelen K, personally performed the services described in this documentation. All medical record entries made by the scribe were at my direction and in my presence.  I have reviewed the chart and discharge instructions and agree that the  record reflects my personal performance and is accurate and complete. Taeshaun Rames-RASCH,Shaquala Broeker K.  09/05/2015. 3:37 AM.      Javana Schey, MD 09/05/15 [phone]

## 2015-09-05 ENCOUNTER — Encounter (HOSPITAL_COMMUNITY): Payer: Self-pay | Admitting: Radiology

## 2015-09-05 ENCOUNTER — Emergency Department (HOSPITAL_COMMUNITY): Payer: BLUE CROSS/BLUE SHIELD

## 2015-09-05 LAB — COMPREHENSIVE METABOLIC PANEL
ALBUMIN: 4.5 g/dL (ref 3.5–5.0)
ALT: 24 U/L (ref 17–63)
ANION GAP: 8 (ref 5–15)
AST: 30 U/L (ref 15–41)
Alkaline Phosphatase: 70 U/L (ref 38–126)
BUN: 13 mg/dL (ref 6–20)
CHLORIDE: 103 mmol/L (ref 101–111)
CO2: 29 mmol/L (ref 22–32)
Calcium: 9.1 mg/dL (ref 8.9–10.3)
Creatinine, Ser: 1.08 mg/dL (ref 0.61–1.24)
GFR calc Af Amer: >60 mL/min (ref >60)
GFR calc non Af Amer: >60 mL/min (ref >60)
GLUCOSE: 180 mg/dL — AB (ref 65–99)
POTASSIUM: 4.4 mmol/L (ref 3.5–5.1)
SODIUM: 140 mmol/L (ref 135–145)
TOTAL PROTEIN: 8.3 g/dL — AB (ref 6.5–8.1)
Total Bilirubin: 1 mg/dL (ref 0.3–1.2)

## 2015-09-05 LAB — LIPASE, BLOOD: LIPASE: 22 U/L (ref 22–51)

## 2015-09-05 MED ORDER — IOHEXOL 300 MG/ML  SOLN
100.0000 mL | Freq: Once | INTRAMUSCULAR | Status: AC | PRN
  Administered 2015-09-05: 100 mL via INTRAVENOUS

## 2015-09-05 MED ORDER — IOHEXOL 300 MG/ML  SOLN
50.0000 mL | Freq: Once | INTRAMUSCULAR | Status: AC | PRN
  Administered 2015-09-05: 50 mL via ORAL

## 2015-09-05 MED ORDER — MORPHINE SULFATE (PF) 4 MG/ML IV SOLN
4.0000 mg | Freq: Once | INTRAVENOUS | Status: AC
  Administered 2015-09-05: 4 mg via INTRAVENOUS
  Filled 2015-09-05: qty 1

## 2015-09-05 MED ORDER — ONDANSETRON 8 MG PO TBDP: ORAL_TABLET | ORAL | Status: DC

## 2015-09-05 MED ORDER — DICYCLOMINE HCL 20 MG PO TABS: 20.0000 mg | ORAL_TABLET | Freq: Two times a day (BID) | ORAL | Status: DC

## 2015-09-05 MED ORDER — OMEPRAZOLE 20 MG PO CPDR: 20.0000 mg | DELAYED_RELEASE_CAPSULE | Freq: Every day | ORAL | Status: DC

## 2015-09-05 NOTE — ED Notes (Signed)
PT WAS UNABLE TO USE RESTROOM AT THIS TIME

## 2015-09-05 NOTE — ED Notes (Signed)
Pt is aware we will need new urine sample

## 2016-01-28 ENCOUNTER — Encounter (HOSPITAL_COMMUNITY): Payer: Self-pay | Admitting: *Deleted

## 2016-01-28 ENCOUNTER — Emergency Department (HOSPITAL_COMMUNITY): Payer: BLUE CROSS/BLUE SHIELD

## 2016-01-28 ENCOUNTER — Emergency Department (HOSPITAL_COMMUNITY)
Admission: EM | Admit: 2016-01-28 | Discharge: 2016-01-28 | Disposition: A | Discharge status: Home / Self Care | Payer: BLUE CROSS/BLUE SHIELD | Attending: Emergency Medicine | Admitting: Emergency Medicine

## 2016-01-28 DIAGNOSIS — I10 Essential (primary) hypertension: Secondary | ICD-10-CM | POA: Diagnosis not present

## 2016-01-28 DIAGNOSIS — E119 Type 2 diabetes mellitus without complications: Secondary | ICD-10-CM | POA: Insufficient documentation

## 2016-01-28 DIAGNOSIS — R05 Cough: Secondary | ICD-10-CM | POA: Diagnosis present

## 2016-01-28 DIAGNOSIS — Z79899 Other long term (current) drug therapy: Secondary | ICD-10-CM | POA: Diagnosis not present

## 2016-01-28 DIAGNOSIS — Z87438 Personal history of other diseases of male genital organs: Secondary | ICD-10-CM | POA: Insufficient documentation

## 2016-01-28 DIAGNOSIS — Z880 Allergy status to penicillin: Secondary | ICD-10-CM | POA: Insufficient documentation

## 2016-01-28 DIAGNOSIS — J40 Bronchitis, not specified as acute or chronic: Secondary | ICD-10-CM

## 2016-01-28 DIAGNOSIS — K219 Gastro-esophageal reflux disease without esophagitis: Secondary | ICD-10-CM | POA: Diagnosis not present

## 2016-01-28 DIAGNOSIS — E785 Hyperlipidemia, unspecified: Secondary | ICD-10-CM | POA: Diagnosis not present

## 2016-01-28 DIAGNOSIS — Z7984 Long term (current) use of oral hypoglycemic drugs: Secondary | ICD-10-CM | POA: Diagnosis not present

## 2016-01-28 DIAGNOSIS — J209 Acute bronchitis, unspecified: Secondary | ICD-10-CM | POA: Diagnosis not present

## 2016-01-28 LAB — BASIC METABOLIC PANEL
Anion gap: 9 (ref 5–15)
BUN: 10 mg/dL (ref 6–20)
CHLORIDE: 106 mmol/L (ref 101–111)
CO2: 27 mmol/L (ref 22–32)
CREATININE: 0.91 mg/dL (ref 0.61–1.24)
Calcium: 8.7 mg/dL — ABNORMAL LOW (ref 8.9–10.3)
GFR calc Af Amer: >60 mL/min (ref >60)
GFR calc non Af Amer: >60 mL/min (ref >60)
GLUCOSE: 139 mg/dL — AB (ref 65–99)
POTASSIUM: 4.7 mmol/L (ref 3.5–5.1)
SODIUM: 142 mmol/L (ref 135–145)

## 2016-01-28 LAB — CBC WITH DIFFERENTIAL/PLATELET
BASOS PCT: 0 %
Basophils Absolute: 0 10*3/uL (ref 0.0–0.1)
EOS ABS: 0.1 10*3/uL (ref 0.0–0.7)
EOS PCT: 4 %
HEMATOCRIT: 42.5 % (ref 39.0–52.0)
HEMOGLOBIN: 13.8 g/dL (ref 13.0–17.0)
LYMPHS PCT: 47 %
Lymphs Abs: 1.3 10*3/uL (ref 0.7–4.0)
MCH: 23.4 pg — AB (ref 26.0–34.0)
MCHC: 32.5 g/dL (ref 30.0–36.0)
MCV: 72.2 fL — ABNORMAL LOW (ref 78.0–100.0)
MONOS PCT: 16 %
Monocytes Absolute: 0.5 10*3/uL (ref 0.1–1.0)
NEUTROS ABS: 1 10*3/uL — AB (ref 1.7–7.7)
NEUTROS PCT: 33 %
Platelets: 248 10*3/uL (ref 150–400)
RBC: 5.89 MIL/uL — ABNORMAL HIGH (ref 4.22–5.81)
RDW: 13.9 % (ref 11.5–15.5)
WBC: 2.9 10*3/uL — ABNORMAL LOW (ref 4.0–10.5)

## 2016-01-28 LAB — I-STAT TROPONIN, ED: Troponin i, poc: 0 ng/mL (ref 0.00–0.08)

## 2016-01-28 MED ORDER — GUAIFENESIN-CODEINE 100-10 MG/5ML PO SYRP: 5.0000 mL | ORAL_SOLUTION | Freq: Three times a day (TID) | ORAL | Status: DC | PRN

## 2016-01-28 MED ORDER — GUAIFENESIN ER 600 MG PO TB12: 600.0000 mg | ORAL_TABLET | Freq: Two times a day (BID) | ORAL | Status: DC

## 2016-01-28 MED ORDER — AZITHROMYCIN 250 MG PO TABS: 250.0000 mg | ORAL_TABLET | Freq: Every day | ORAL | Status: DC

## 2016-01-28 NOTE — ED Provider Notes (Signed)
CSN: [id number]     Arrival date & time 01/28/16  0522 History   First MD Initiated Contact with Patient 01/28/16 [phone]     Chief Complaint  Patient presents with  . Cough     HPI  Residual cough. Symptomatically for 5-6 days. Weakness in the morning with productive cough. No sinus pain or pressure. No chest pain. No fevers but felt chilled with shaking yesterday. Dry nonproductive. No chest pain.  Past Medical History  Diagnosis Date  . Diabetes mellitus   . Hypertension   . HLD (hyperlipidemia)   . GERD (gastroesophageal reflux disease)   . Hydrocele    Past Surgical History  Procedure Laterality Date  . Appendectomy    . Hemorrhoid surgery     Family History  Problem Relation Age of Onset  . Brain cancer Brother     deceased age 45  . Lung cancer Brother     deceased age 29  . Colon cancer Neg Hx   . Diabetes Mother   . Diabetes Father   . Heart disease Mother     CHF, deceased 64  . Kidney failure Father     deceased 69   Social History  Substance Use Topics  . Smoking status: Never Smoker   . Smokeless tobacco: Never Used  . Alcohol Use: No    Review of Systems  Constitutional: Negative for fever, chills, diaphoresis, appetite change and fatigue.  HENT: Negative for mouth sores, sore throat and trouble swallowing.   Eyes: Negative for visual disturbance.  Respiratory: Positive for cough and shortness of breath. Negative for chest tightness and wheezing.   Cardiovascular: Negative for chest pain.  Gastrointestinal: Negative for nausea, vomiting, abdominal pain, diarrhea and abdominal distention.  Endocrine: Negative for polydipsia, polyphagia and polyuria.  Genitourinary: Negative for dysuria, frequency and hematuria.  Musculoskeletal: Negative for gait problem.  Skin: Negative for color change, pallor and rash.  Neurological: Negative for dizziness, syncope, light-headedness and headaches.  Hematological: Does not bruise/bleed easily.   Psychiatric/Behavioral: Negative for behavioral problems and confusion.      Allergies  Amoxicillin; Hydrocodone; Penicillins; and Percocet  Home Medications   Prior to Admission medications   Medication Sig Start Date End Date Taking? Authorizing Provider  aspirin EC 81 MG tablet Take 81 mg by mouth daily.    Historical Provider, MD  azithromycin (ZITHROMAX Z-PAK) 250 MG tablet Take 1 tablet (250 mg total) by mouth daily. 01/28/16   Rolland Porter, MD  dicyclomine (BENTYL) 20 MG tablet Take 1 tablet (20 mg total) by mouth 2 (two) times daily. 09/05/15   April Palumbo, MD  glipiZIDE (GLUCOTROL XL) 10 MG 24 hr tablet Take 10 mg by mouth daily.    Historical Provider, MD  guaiFENesin (MUCINEX) 600 MG 12 hr tablet Take 1 tablet (600 mg total) by mouth 2 (two) times daily. 01/28/16   Rolland Porter, MD  guaiFENesin-codeine (CHERATUSSIN AC) 100-10 MG/5ML syrup Take 5 mLs by mouth 3 (three) times daily as needed for cough. 01/28/16   Rolland Porter, MD  metFORMIN (GLUCOPHAGE-XR) 750 MG 24 hr tablet Take 1,500 mg by mouth at bedtime.     Historical Provider, MD  Multiple Vitamin (MULTIVITAMIN WITH MINERALS) TABS Take 1 tablet by mouth daily.    Historical Provider, MD  omeprazole (PRILOSEC) 20 MG capsule Take 1 capsule (20 mg total) by mouth daily. 08/28/14   April Palumbo, MD  omeprazole (PRILOSEC) 20 MG capsule Take 1 capsule (20 mg total) by mouth daily. 09/05/15  April Palumbo, MD  ondansetron [facility](ZOFRAN ODT) 8 MG disintegrating tablet 8mg  ODT q8 hours prn nausea 09/05/15   April Palumbo, MD  simvastatin (ZOCOR) 40 MG tablet Take 40 mg by mouth daily.     Historical Provider, MD  telmisartan (MICARDIS) 80 MG tablet Take 80 mg by mouth every morning.     Historical Provider, MD   BP 145/95 mmHg  Pulse 78  Temp(Src) 97.9 F (36.6 C) (Oral)  Resp 18  Ht 5\' 5"  (1.651 m)  Wt 180 lb (81.647 kg)  BMI 29.95 kg/m2  SpO2 97% Physical Exam  Constitutional: He is oriented to person, place, and time. He appears  well-developed and well-nourished. No distress.  HENT:  Head: Normocephalic.  Eyes: Conjunctivae are normal. Pupils are equal, round, and reactive to light. No scleral icterus.  Neck: Normal range of motion. Neck supple. No thyromegaly present.  Cardiovascular: Normal rate and regular rhythm.  Exam reveals no gallop and no friction rub.   No murmur heard. Pulmonary/Chest: Effort normal and breath sounds normal. No respiratory distress. He has no wheezes. He has no rales.  Clear bilateral breath sounds. No Wheezing or prolongation.  Abdominal: Soft. Bowel sounds are normal. He exhibits no distension. There is no tenderness. There is no rebound.  Musculoskeletal: Normal range of motion.  Neurological: He is alert and oriented to person, place, and time.  Skin: Skin is warm and dry. No rash noted.  Psychiatric: He has a normal mood and affect. His behavior is normal.    ED Course  Procedures (including critical care time) Labs Review Labs Reviewed  BASIC METABOLIC PANEL  CBC WITH DIFFERENTIAL/PLATELET  Rose[facility]a SensorI-STAT TROPOININ, ED    Imaging Review Dg Chest 2 View  01/28/2016  CLINICAL DATA:  Acute onset of cough and congestion. Initial encounter. EXAM: CHEST  2 VIEW COMPARISON:  Chest radiograph performed 09/05/2015 FINDINGS: There is elevation of the right hemidiaphragm. There is no evidence of focal opacification, pleural effusion or pneumothorax. The heart is normal in size; the mediastinal contour is within normal limits. No acute osseous abnormalities are seen. IMPRESSION: Elevation of the right hemidiaphragm.  Lungs remain grossly clear. Electronically Signed   By: Roanna RaiderJeffery  Chang M.D.   On: 01/28/2016 06:46   I have personally reviewed and evaluated these images and lab results as part of my medical decision-making.   EKG Interpretation None      MDM   Final diagnoses:  Bronchitis  Acute bronchitis, unspecified organism    Reassuring chest x-ray.  Afebrile, not hypoxemic. Plan  Zithromax, aspirin, Mucinex. Primary care follow-up.    Rolland PorterMark Bryant Lipps, MD 01/28/16 [phone]

## 2016-01-28 NOTE — ED Notes (Signed)
Pt reports cough, congestion and throat irritation for about 1 week, denies fevers.

## 2016-03-21 ENCOUNTER — Encounter: Payer: Self-pay | Admitting: Hematology & Oncology

## 2016-04-01 ENCOUNTER — Other Ambulatory Visit: Payer: Self-pay | Admitting: Family

## 2016-04-01 ENCOUNTER — Encounter: Payer: Self-pay | Admitting: Family

## 2016-04-01 ENCOUNTER — Ambulatory Visit: Payer: BLUE CROSS/BLUE SHIELD

## 2016-04-01 ENCOUNTER — Other Ambulatory Visit (HOSPITAL_BASED_OUTPATIENT_CLINIC_OR_DEPARTMENT_OTHER): Payer: BLUE CROSS/BLUE SHIELD

## 2016-04-01 ENCOUNTER — Ambulatory Visit (HOSPITAL_BASED_OUTPATIENT_CLINIC_OR_DEPARTMENT_OTHER): Payer: BLUE CROSS/BLUE SHIELD | Admitting: Family

## 2016-04-01 VITALS — BP 171/92 | HR 74 | Temp 98.0°F | Resp 16 | Ht 65.0 in | Wt 183.0 lb

## 2016-04-01 DIAGNOSIS — E119 Type 2 diabetes mellitus without complications: Secondary | ICD-10-CM

## 2016-04-01 DIAGNOSIS — D649 Anemia, unspecified: Secondary | ICD-10-CM

## 2016-04-01 LAB — CBC WITH DIFFERENTIAL (CANCER CENTER ONLY)
BASO#: 0 10*3/uL (ref 0.0–0.2)
BASO%: 0.2 % (ref 0.0–2.0)
EOS ABS: 0.1 10*3/uL (ref 0.0–0.5)
EOS%: 2.3 % (ref 0.0–7.0)
HCT: 41.1 % (ref 38.7–49.9)
HEMOGLOBIN: 13.7 g/dL (ref 13.0–17.1)
LYMPH#: 2.6 10*3/uL (ref 0.9–3.3)
LYMPH%: 41.5 % (ref 14.0–48.0)
MCH: 23.7 pg — AB (ref 28.0–33.4)
MCHC: 33.3 g/dL (ref 32.0–35.9)
MCV: 71 fL — ABNORMAL LOW (ref 82–98)
MONO#: 0.6 10*3/uL (ref 0.1–0.9)
MONO%: 9.3 % (ref 0.0–13.0)
NEUT#: 2.9 10*3/uL (ref 1.5–6.5)
NEUT%: 46.7 % (ref 40.0–80.0)
Platelets: 328 10*3/uL (ref 145–400)
RBC: 5.79 10*6/uL — ABNORMAL HIGH (ref 4.20–5.70)
RDW: 15.2 % (ref 11.1–15.7)
WBC: 6.1 10*3/uL (ref 4.0–10.0)

## 2016-04-01 LAB — COMPREHENSIVE METABOLIC PANEL (CC13)
ALBUMIN: 4.3 g/dL (ref 3.6–4.8)
ALT: 15 IU/L (ref 0–44)
AST (SGOT): 18 IU/L (ref 0–40)
Albumin/Globulin Ratio: 1.4 (ref 1.2–2.2)
Alkaline Phosphatase, S: 63 IU/L (ref 39–117)
BUN / CREAT RATIO: 9 — AB (ref 10–24)
BUN: 9 mg/dL (ref 8–27)
Bilirubin Total: 0.5 mg/dL (ref 0.0–1.2)
CO2: 27 mmol/L (ref 18–29)
CREATININE: 1.03 mg/dL (ref 0.76–1.27)
Calcium, Ser: 9.3 mg/dL (ref 8.6–10.2)
Chloride, Ser: 104 mmol/L (ref 96–106)
GFR, EST AFRICAN AMERICAN: 90 mL/min/{1.73_m2} (ref >59)
GFR, EST NON AFRICAN AMERICAN: 77 mL/min/{1.73_m2} (ref >59)
GLOBULIN, TOTAL: 3.1 g/dL (ref 1.5–4.5)
GLUCOSE: 108 mg/dL — AB (ref 65–99)
Potassium, Ser: 4.1 mmol/L (ref 3.5–5.2)
SODIUM: 142 mmol/L (ref 134–144)
TOTAL PROTEIN: 7.4 g/dL (ref 6.0–8.5)

## 2016-04-01 LAB — CHCC SATELLITE - SMEAR

## 2016-04-01 MED ORDER — FOLIC ACID 1 MG PO TABS: 1.0000 mg | ORAL_TABLET | Freq: Every day | ORAL | Status: DC

## 2016-04-01 NOTE — Progress Notes (Signed)
Hematology/Oncology Consultation   Name: COLTEN DESROCHES      MRN: [id number]    Location: Room/bed info not found  Date: 04/01/2016 Time:4:54 PM   REFERRING PHYSICIAN: Megan A. Durene Cal, FNP  REASON FOR CONSULT: Anemia   DIAGNOSIS: Anemia  HISTORY OF PRESENT ILLNESS: Mr. Laur is a very pleasant 65 yo [place] American male with a recent diagnosis of anemia with his PCP. His Hgb at this time is 13.7 with an MCV of 71. We are checking an alpha thalassemia genotype on him today. We will go ahead and have him start taking folic acid daily.  His iron saturation was 26% and ferrtin 78 on April 5th. We rechecked these today.  No family history of anemia. No sickle cell trait or disease. No personal cancer history. His brother was a smoker and passed away from lung cancer.   He denies fatigue, fever, chills, ice cravings, n/v, cough, rash, dizziness, SOB, chest pain, palpitations, abdominal pain or changes in bowel or bladder habits.  He is diabetic and states that his blood sugars have been well controlled on Metformin and Glipizide. His Hgb A1c was 6.2 in April.  He eats a healthy diet and stays well hydrated. His weight is stable.   No swelling, tenderness, numbness or tingling in his extremities. No c/o joint aches or "bone" pain.  He stays active and works out at [employer] 5 days a week.  He does not smoke or drink alcohol.  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   ROS: All other 10 point review of systems is negative.   PAST MEDICAL HISTORY:   Past Medical History  Diagnosis Date  . Diabetes mellitus   . Hypertension   . HLD (hyperlipidemia)   . GERD (gastroesophageal reflux disease)   . Hydrocele     ALLERGIES: Allergies  Allergen Reactions  . Amoxicillin     rash  . Hydrocodone     swelling  . Penicillins     rash  . Percocet [Oxycodone-Acetaminophen] Itching and Rash      MEDICATIONS:  Current Outpatient Prescriptions on File  Prior to Visit  Medication Sig Dispense Refill  . aspirin EC 81 MG tablet Take 81 mg by mouth daily.    Marland Kitchen glipiZIDE (GLUCOTROL XL) 10 MG 24 hr tablet Take 10 mg by mouth daily.    . metFORMIN (GLUCOPHAGE-XR) 750 MG 24 hr tablet Take 1,500 mg by mouth at bedtime.     . Multiple Vitamin (MULTIVITAMIN WITH MINERALS) TABS Take 1 tablet by mouth daily.    . simvastatin (ZOCOR) 40 MG tablet Take 40 mg by mouth daily.     Marland Kitchen telmisartan (MICARDIS) 80 MG tablet Take 80 mg by mouth every morning.      No current facility-administered medications on file prior to visit.     PAST SURGICAL HISTORY Past Surgical History  Procedure Laterality Date  . Appendectomy    . Hemorrhoid surgery      FAMILY HISTORY: Family History  Problem Relation Age of Onset  . Brain cancer Brother     deceased age 39  . Lung cancer Brother     deceased age 7  . Colon cancer Neg Hx   . Diabetes Mother   . Diabetes Father   . Heart disease Mother     CHF, deceased 39  . Kidney failure Father     deceased 31    SOCIAL HISTORY:  reports  that he has never smoked. He has never used smokeless tobacco. He reports that he does not drink alcohol or use illicit drugs.  PERFORMANCE STATUS: The patient's performance status is 0 - Asymptomatic  PHYSICAL EXAM: Most Recent Vital Signs: Blood pressure 171/92, pulse 74, temperature 98 F (36.7 C), temperature source Oral, resp. rate 16, height 5\' 5"  (1.651 m), weight 183 lb (83.008 kg). BP 171/92 mmHg  Pulse 74  Temp(Src) 98 F (36.7 C) (Oral)  Resp 16  Ht 5\' 5"  (1.651 m)  Wt 183 lb (83.008 kg)  BMI 30.45 kg/m2  General Appearance:    Alert, cooperative, no distress, appears stated age  Head:    Normocephalic, without obvious abnormality, atraumatic  Eyes:    PERRL, conjunctiva/corneas clear, EOM's intact, fundi    benign, both eyes             Throat:   Lips, mucosa, and tongue normal; teeth and gums normal  Neck:   Supple, symmetrical, trachea midline, no  adenopathy;       thyroid:  No enlargement/tenderness/nodules; no carotid   bruit or JVD  Back:     Symmetric, no curvature, ROM normal, no CVA tenderness  Lungs:     Clear to auscultation bilaterally, respirations unlabored  Chest wall:    No tenderness or deformity  Heart:    Regular rate and rhythm, S1 and S2 normal, no murmur, rub   or gallop  Abdomen:     Soft, non-tender, bowel sounds active all four quadrants,    no masses, no organomegaly        Extremities:   Extremities normal, atraumatic, no cyanosis or edema  Pulses:   2+ and symmetric all extremities  Skin:   Skin color, texture, turgor normal, no rashes or lesions  Lymph nodes:   Cervical, supraclavicular, and axillary nodes normal  Neurologic:   CNII-XII intact. Normal strength, sensation and reflexes      throughout    LABORATORY DATA:  Results for orders placed or performed in visit on 04/01/16 (from the past 48 hour(s))  CBC w/Diff     Status: Abnormal   Collection Time: 04/01/16  2:41 PM  Result Value Ref Range   WBC 6.1 4.0 - 10.0 10e3/uL   RBC 5.79 (H) 4.20 - 5.70 10e6/uL   HGB 13.7 13.0 - 17.1 g/dL   HCT 40.9 81.1 - 91.4 %   MCV 71 (L) 82 - 98 fL   MCH 23.7 (L) 28.0 - 33.4 pg   MCHC 33.3 32.0 - 35.9 g/dL   RDW 78.2 95.6 - 21.3 %   Platelets 328 145 - 400 10e3/uL   NEUT# 2.9 1.5 - 6.5 10e3/uL   LYMPH# 2.6 0.9 - 3.3 10e3/uL   MONO# 0.6 0.1 - 0.9 10e3/uL   Eosinophils Absolute 0.1 0.0 - 0.5 10e3/uL   BASO# 0.0 0.0 - 0.2 10e3/uL   NEUT% 46.7 40.0 - 80.0 %   LYMPH% 41.5 14.0 - 48.0 %   MONO% 9.3 0.0 - 13.0 %   EOS% 2.3 0.0 - 7.0 %   BASO% 0.2 0.0 - 2.0 %  Smear     Status: None   Collection Time: 04/01/16  2:41 PM  Result Value Ref Range   Smear Result Smear Available       RADIOGRAPHY: No results found.     PATHOLOGY: None  ASSESSMENT/PLAN: Mr. Hochmuth is a very pleasant 65 yo [place] American male with a recent diagnosis of anemia. He is asymptomatic and  has no complaints at this time.  His Hgb  today is 13.7 with an MCV of 71. He had several target cell observed on his blood smear. His alpha thalassemia genotype is pending. We will go ahead and start him on folic acid 1 mg PO daily.  We will see what his iron studies and other lab work from today shows.  All questions were answered. At this point we do not need to see him back in our office. He knows to call our office with any questions or concerns. We can certainly see him for an hem/onc issues in the future.   He was discussed with and also seen by Dr. Myna HidalgoEnnever and he is in agreement with the aforementioned.   [facility],[name]    Addendum:  I saw and examined patient with Sarah. We looked at his last smear. He does have some target cells.  He is not even anemic. I think his MCV is low because of a hemoglobinopathy. I suspect he probably does have thalassemia. I probably would suspect alpha thalassemia. He does not have sickle cell. 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 I do not see anything that looks like a sickle cell variant.  His exam is pretty much unremarkable. There is no splenomegaly.  Again, he is not even anemic. His iron studies done not been all that bad.  I don't think we have to get him back to the office. Much of that we are adding to his medical care.  We spent about 45 minutes with him. He has all his questions. I think in for serving our country. He is in the [employer] for 10 years.  Hewitt ShortsPete E

## 2016-04-02 LAB — IRON AND TIBC
%SAT: 24 % (ref 20–55)
IRON: 74 ug/dL (ref 42–163)
TIBC: 309 ug/dL (ref 202–409)
UIBC: 235 ug/dL (ref 117–376)

## 2016-04-02 LAB — FERRITIN: Ferritin: 109 ng/ml (ref 22–316)

## 2016-04-02 LAB — RETICULOCYTES: Reticulocyte Count: 1.1 % (ref 0.6–2.6)

## 2016-04-02 LAB — ERYTHROPOIETIN: Erythropoietin: 13.6 m[IU]/mL (ref 2.6–18.5)

## 2016-04-03 LAB — HEMOGLOBINOPATHY EVALUATION
HEMOGLOBIN F QUANTITATION: 0 % (ref 0.0–2.0)
HGB C: 0 %
HGB S: 0 %
Hemoglobin A2 Quantitation: 2.1 % (ref 0.7–3.1)
Hgb A: 97.9 % (ref 94.0–98.0)

## 2016-04-10 LAB — ALPHA-THALASSEMIA GENOTYPR: PDF: 0

## 2016-08-18 ENCOUNTER — Encounter (HOSPITAL_COMMUNITY): Payer: Self-pay | Admitting: Emergency Medicine

## 2016-08-18 ENCOUNTER — Emergency Department (HOSPITAL_COMMUNITY)
Admission: EM | Admit: 2016-08-18 | Discharge: 2016-08-18 | Disposition: A | Discharge status: Home / Self Care | Payer: BLUE CROSS/BLUE SHIELD | Attending: Emergency Medicine | Admitting: Emergency Medicine

## 2016-08-18 DIAGNOSIS — Z7982 Long term (current) use of aspirin: Secondary | ICD-10-CM | POA: Diagnosis not present

## 2016-08-18 DIAGNOSIS — Y990 Civilian activity done for income or pay: Secondary | ICD-10-CM | POA: Diagnosis not present

## 2016-08-18 DIAGNOSIS — S0990XA Unspecified injury of head, initial encounter: Secondary | ICD-10-CM | POA: Insufficient documentation

## 2016-08-18 DIAGNOSIS — Y929 Unspecified place or not applicable: Secondary | ICD-10-CM | POA: Diagnosis not present

## 2016-08-18 DIAGNOSIS — Z79899 Other long term (current) drug therapy: Secondary | ICD-10-CM | POA: Insufficient documentation

## 2016-08-18 DIAGNOSIS — Z7984 Long term (current) use of oral hypoglycemic drugs: Secondary | ICD-10-CM | POA: Diagnosis not present

## 2016-08-18 DIAGNOSIS — Y939 Activity, unspecified: Secondary | ICD-10-CM | POA: Diagnosis not present

## 2016-08-18 DIAGNOSIS — E119 Type 2 diabetes mellitus without complications: Secondary | ICD-10-CM | POA: Diagnosis not present

## 2016-08-18 DIAGNOSIS — I10 Essential (primary) hypertension: Secondary | ICD-10-CM | POA: Diagnosis not present

## 2016-08-18 DIAGNOSIS — W228XXA Striking against or struck by other objects, initial encounter: Secondary | ICD-10-CM | POA: Insufficient documentation

## 2016-08-18 MED ORDER — ACETAMINOPHEN 500 MG PO TABS
1000.0000 mg | ORAL_TABLET | Freq: Once | ORAL | Status: AC
  Administered 2016-08-18: 1000 mg via ORAL
  Filled 2016-08-18: qty 2

## 2016-08-18 NOTE — ED Triage Notes (Addendum)
Pt reports part of a shelf fell at work and hit him on the R top of his head. No LOC. Pt reports head pain. Not on blood thinners.

## 2016-08-18 NOTE — ED Notes (Signed)
No respiratory or acute distress noted alert and oriented x 3 call light in reach clear speech noted moves all extremities. States headache 8/10.

## 2016-08-18 NOTE — ED Provider Notes (Signed)
[facility]-EMERGENCY DEPT Provider Note   CSN: [id number] Arrival date & time: 08/18/16  1715     History   Chief Complaint Chief Complaint  Patient presents with  . Head Injury    HPI James [name] is a 65 y.o. male.  Patient presents to the emergency department with chief complaint of headache. He states that a shelf fell on him and hit his head today at work. He denies any LOC. Denies numbness, weakness, or tingling. Denies any slurred speech or vision changes. Denies any nausea or vomiting. Denies confusion. He has not taken anything for his symptoms. Reports mild headache. There are no other associated symptoms.   The history is provided by the patient. No language interpreter was used.  Head Injury      Past Medical History:  Diagnosis Date  . Diabetes mellitus   . GERD (gastroesophageal reflux disease)   . HLD (hyperlipidemia)   . Hydrocele   . Hypertension     Patient Active Problem List   Diagnosis Date Noted  . Hypertension 02/22/2011  . Diabetes mellitus type II 02/22/2011  . Anemia, unspecified 02/22/2011  . Enteritis 02/22/2011    Past Surgical History:  Procedure Laterality Date  . APPENDECTOMY    . HEMORRHOID SURGERY         Home Medications    Prior to Admission medications   Medication Sig Start Date End Date Taking? Authorizing Provider  aspirin EC 81 MG tablet Take 81 mg by mouth daily.    Historical Provider, MD  ferrous sulfate 325 (65 FE) MG tablet Take 325 mg by mouth.    Historical Provider, MD  folic acid (FOLVITE) 1 MG tablet Take 1 tablet (1 mg total) by mouth daily. 04/01/16   Verdie Mosher, NP  glipiZIDE (GLUCOTROL XL) 10 MG 24 hr tablet Take 10 mg by mouth daily.    Historical Provider, MD  ibuprofen (ADVIL,MOTRIN) 800 MG tablet Take 800 mg by mouth. 03/29/16   Historical Provider, MD  metFORMIN (GLUCOPHAGE-XR) 750 MG 24 hr tablet Take 1,500 mg by mouth at bedtime.     Historical Provider, MD  Multiple Vitamin (MULTIVITAMIN WITH  MINERALS) TABS Take 1 tablet by mouth daily.    Historical Provider, MD  simvastatin (ZOCOR) 40 MG tablet Take 40 mg by mouth daily.     Historical Provider, MD  telmisartan (MICARDIS) 80 MG tablet Take 80 mg by mouth every morning.     Historical Provider, MD    Family History Family History  Problem Relation Age of Onset  . Brain cancer Brother     deceased age 31  . Lung cancer Brother     deceased age 16  . Colon cancer Neg Hx   . Diabetes Mother   . Diabetes Father   . Heart disease Mother     CHF, deceased 68  . Kidney failure Father     deceased 15    Social History Social History  Substance Use Topics  . Smoking status: Never Smoker  . Smokeless tobacco: Never Used  . Alcohol use No     Allergies   Amoxicillin; Hydrocodone; Penicillins; and Percocet [oxycodone-acetaminophen]   Review of Systems Review of Systems  All other systems reviewed and are negative.    Physical Exam Updated Vital Signs BP 137/99   Pulse 77   Temp 98.1 F (36.7 C) (Oral)   Resp 16   SpO2 94%   Physical Exam  Constitutional: He is oriented to person, place,  and time. He appears well-developed and well-nourished.  HENT:  Head: Normocephalic and atraumatic.  Right Ear: External ear normal.  Left Ear: External ear normal.  Eyes: Conjunctivae and EOM are normal. Pupils are equal, round, and reactive to light.  Neck: Normal range of motion. Neck supple.  No pain with neck flexion, no meningismus  Cardiovascular: Normal rate, regular rhythm and normal heart sounds.  Exam reveals no gallop and no friction rub.   No murmur heard. Pulmonary/Chest: Effort normal and breath sounds normal. No respiratory distress. He has no wheezes. He has no rales. He exhibits no tenderness.  Abdominal: Soft. He exhibits no distension and no mass. There is no tenderness. There is no rebound and no guarding.  Musculoskeletal: Normal range of motion. He exhibits no edema or tenderness.  Normal gait.    Neurological: He is alert and oriented to person, place, and time. He has normal reflexes.  CN 3-12 intact, normal finger to nose, no pronator drift, sensation and strength intact bilaterally.  Skin: Skin is warm and dry.  Psychiatric: He has a normal mood and affect. His behavior is normal. Judgment and thought content normal.  Nursing note and vitals reviewed.    ED Treatments / Results  Labs (all labs ordered are listed, but only abnormal results are displayed) Labs Reviewed - No data to display  EKG  EKG Interpretation None       Radiology No results found.  Procedures Procedures (including critical care time)  Medications Ordered in ED Medications  acetaminophen (TYLENOL) tablet 1,000 mg (1,000 mg Oral Given 08/18/16 1922)     Initial Impression / Assessment and Plan / ED Course  I have reviewed the triage vital signs and the nursing notes.  Pertinent labs & imaging results that were available during my care of the patient were reviewed by me and considered in my medical decision making (see chart for details).  Clinical Course    Patient with minor head injury today. A shelf fell and hit his head. He did not pass out. He has no vomiting. He is neurovascularly intact. No indication for imaging at this time. Will give Tylenol and discharged home.  Final Clinical Impressions(s) / ED Diagnoses   Final diagnoses:  Head injury, initial encounter    New Prescriptions New Prescriptions   No medications on file     Roxy HorsemanRobert Edem Tiegs, PA-C 08/18/16 1939    Tilden FossaElizabeth Rees, MD 08/20/16 [phone]

## 2016-10-31 ENCOUNTER — Other Ambulatory Visit: Payer: Self-pay | Admitting: Family

## 2016-10-31 DIAGNOSIS — D649 Anemia, unspecified: Secondary | ICD-10-CM

## 2017-01-01 ENCOUNTER — Emergency Department (HOSPITAL_COMMUNITY)
Admission: EM | Admit: 2017-01-01 | Discharge: 2017-01-01 | Disposition: A | Discharge status: Home / Self Care | Payer: BLUE CROSS/BLUE SHIELD | Attending: Emergency Medicine | Admitting: Emergency Medicine

## 2017-01-01 ENCOUNTER — Encounter (HOSPITAL_COMMUNITY): Payer: Self-pay | Admitting: Emergency Medicine

## 2017-01-01 DIAGNOSIS — E119 Type 2 diabetes mellitus without complications: Secondary | ICD-10-CM | POA: Insufficient documentation

## 2017-01-01 DIAGNOSIS — R109 Unspecified abdominal pain: Secondary | ICD-10-CM

## 2017-01-01 DIAGNOSIS — Z7984 Long term (current) use of oral hypoglycemic drugs: Secondary | ICD-10-CM | POA: Diagnosis not present

## 2017-01-01 DIAGNOSIS — R1033 Periumbilical pain: Secondary | ICD-10-CM | POA: Diagnosis not present

## 2017-01-01 DIAGNOSIS — I10 Essential (primary) hypertension: Secondary | ICD-10-CM | POA: Insufficient documentation

## 2017-01-01 DIAGNOSIS — Z7982 Long term (current) use of aspirin: Secondary | ICD-10-CM | POA: Insufficient documentation

## 2017-01-01 LAB — COMPREHENSIVE METABOLIC PANEL
ALBUMIN: 4.2 g/dL (ref 3.5–5.0)
ALT: 20 U/L (ref 17–63)
AST: 23 U/L (ref 15–41)
Alkaline Phosphatase: 70 U/L (ref 38–126)
Anion gap: 6 (ref 5–15)
BUN: 14 mg/dL (ref 6–20)
CO2: 28 mmol/L (ref 22–32)
Calcium: 8.8 mg/dL — ABNORMAL LOW (ref 8.9–10.3)
Chloride: 103 mmol/L (ref 101–111)
Creatinine, Ser: 1.09 mg/dL (ref 0.61–1.24)
GFR calc non Af Amer: >60 mL/min (ref >60)
GLUCOSE: 149 mg/dL — AB (ref 65–99)
POTASSIUM: 4.1 mmol/L (ref 3.5–5.1)
SODIUM: 137 mmol/L (ref 135–145)
Total Bilirubin: 1.1 mg/dL (ref 0.3–1.2)
Total Protein: 7.9 g/dL (ref 6.5–8.1)

## 2017-01-01 LAB — CBC WITH DIFFERENTIAL/PLATELET
BASOS ABS: 0 10*3/uL (ref 0.0–0.1)
BASOS PCT: 0 %
EOS ABS: 0.1 10*3/uL (ref 0.0–0.7)
Eosinophils Relative: 2 %
HEMATOCRIT: 41.5 % (ref 39.0–52.0)
HEMOGLOBIN: 13.3 g/dL (ref 13.0–17.0)
LYMPHS PCT: 28 %
Lymphs Abs: 1.8 10*3/uL (ref 0.7–4.0)
MCH: 22.7 pg — ABNORMAL LOW (ref 26.0–34.0)
MCHC: 32 g/dL (ref 30.0–36.0)
MCV: 70.9 fL — ABNORMAL LOW (ref 78.0–100.0)
MONOS PCT: 8 %
Monocytes Absolute: 0.5 10*3/uL (ref 0.1–1.0)
NEUTROS ABS: 4 10*3/uL (ref 1.7–7.7)
NEUTROS PCT: 62 %
Platelets: 295 10*3/uL (ref 150–400)
RBC: 5.85 MIL/uL — ABNORMAL HIGH (ref 4.22–5.81)
RDW: 14.5 % (ref 11.5–15.5)
WBC: 6.4 10*3/uL (ref 4.0–10.5)

## 2017-01-01 LAB — URINALYSIS, ROUTINE W REFLEX MICROSCOPIC
Bilirubin Urine: NEGATIVE
GLUCOSE, UA: NEGATIVE mg/dL
Hgb urine dipstick: NEGATIVE
KETONES UR: NEGATIVE mg/dL
LEUKOCYTES UA: NEGATIVE
NITRITE: NEGATIVE
PH: 5 (ref 5.0–8.0)
PROTEIN: NEGATIVE mg/dL
Specific Gravity, Urine: 1.023 (ref 1.005–1.030)

## 2017-01-01 LAB — LIPASE, BLOOD: LIPASE: 19 U/L (ref 11–51)

## 2017-01-01 MED ORDER — NAPROXEN 500 MG PO TABS
500.0000 mg | ORAL_TABLET | Freq: Once | ORAL | Status: AC
  Administered 2017-01-01: 500 mg via ORAL
  Filled 2017-01-01: qty 1

## 2017-01-01 MED ORDER — NAPROXEN 500 MG PO TABS: 500.0000 mg | ORAL_TABLET | Freq: Two times a day (BID) | ORAL | 0 refills | Status: DC

## 2017-01-01 NOTE — Discharge Instructions (Signed)
Please read and follow all provided instructions.  Your diagnoses today include:  1. Abdominal pain, unspecified abdominal location     Tests performed today include: Blood counts and electrolytes Blood tests to check liver and kidney function Blood tests to check pancreas function Urine test to look for infection and pregnancy (in women) Vital signs. See below for your results today.   Medications prescribed:   Take any prescribed medications only as directed.  Home care instructions:  Follow any educational materials contained in this packet.  Follow-up instructions: Please follow-up with your primary care provider in the next 2 days for further evaluation of your symptoms.    Return instructions:  SEEK IMMEDIATE MEDICAL ATTENTION IF: The pain does not go away or becomes severe  A temperature above 101F develops  Repeated vomiting occurs (multiple episodes)  The pain becomes localized to portions of the abdomen. The right side could possibly be appendicitis. In an adult, the left lower portion of the abdomen could be colitis or diverticulitis.  Blood is being passed in stools or vomit (bright red or black tarry stools)  You develop chest pain, difficulty breathing, dizziness or fainting, or become confused, poorly responsive, or inconsolable (young children) If you have any other emergent concerns regarding your health  Additional Information: Abdominal (belly) pain can be caused by many things. Your caregiver performed an examination and possibly ordered blood/urine tests and imaging (CT scan, x-rays, ultrasound). Many cases can be observed and treated at home after initial evaluation in the emergency department. Even though you are being discharged home, abdominal pain can be unpredictable. Therefore, you need a repeated exam if your pain does not resolve, returns, or worsens. Most patients with abdominal pain don't have to be admitted to the hospital or have surgery, but  serious problems like appendicitis and gallbladder attacks can start out as nonspecific pain. Many abdominal conditions cannot be diagnosed in one visit, so follow-up evaluations are very important.  Your vital signs today were: BP 111/94 (BP Location: Left Arm)    Pulse 84    Temp 97.7 F (36.5 C) (Oral)    Resp 16    Ht 5\' 5"  (1.651 m)    Wt 81.6 kg    SpO2 94%    BMI 29.95 kg/m  If your blood pressure (bp) was elevated above 135/85 this visit, please have this repeated by your doctor within one month. --------------

## 2017-01-01 NOTE — ED Triage Notes (Signed)
Patient is complaining of abdominal pain near his belly button. Patient is also having diarrhea. Patient ate some peanuts and took some robitussin. These symptoms started Tuesday evening.

## 2017-01-01 NOTE — ED Notes (Signed)
Pt reports he still has some nausea but has had no vomiting since yesterday evening

## 2017-01-01 NOTE — ED Provider Notes (Signed)
[facility]ER[facility] DEPT Provider Note   CSN: [id number] Arrival date & time: 01/01/17  0236     History   Chief Complaint Chief Complaint  Patient presents with  . Diarrhea  . Abdominal Pain    HPI James SLOCUMB is a 66 y.o. male.  HPI  66 y.o. male with a hx of DM, HTN, HLD, presents to the Emergency Department today complaining of abdominal pain near belly button. Notes diarrhea with onset yesterday after eating peanuts and taking robitussin. Notes abdominal pain that is periumbilical and RLQ that is intermittent and rates 7/10. Notes no pain currently. No fevers. No N/V. No dysuria. States diarrhea has been subsiding since being in ED. No CP/SOB. Notes regular bowel movements otherwise. No hx diverticulitis/diverticulosis. Able to tolerate PO. No recent travel. No recent antibiotics. Pt has had appendectomy. No other symptoms noted.    Past Medical History:  Diagnosis Date  . Diabetes mellitus   . GERD (gastroesophageal reflux disease)   . HLD (hyperlipidemia)   . Hydrocele   . Hypertension     Patient Active Problem List   Diagnosis Date Noted  . Hypertension 02/22/2011  . Diabetes mellitus type II 02/22/2011  . Anemia, unspecified 02/22/2011  . Enteritis 02/22/2011    Past Surgical History:  Procedure Laterality Date  . APPENDECTOMY    . HEMORRHOID SURGERY         Home Medications    Prior to Admission medications   Medication Sig Start Date End Date Taking? Authorizing Provider  aspirin EC 81 MG tablet Take 81 mg by mouth daily.   Yes Historical Provider, MD  ferrous sulfate 325 (65 FE) MG tablet Take 325 mg by mouth.   Yes Historical Provider, MD  folic acid (FOLVITE) 1 MG tablet TAKE ONE TABLET BY MOUTH ONCE DAILY 10/31/16  Yes Brand Males [place], NP  glipiZIDE (GLUCOTROL XL) 10 MG 24 hr tablet Take 10 mg by mouth daily.   Yes Historical Provider, MD  ibuprofen (ADVIL,MOTRIN) 800 MG tablet Take 800 mg by mouth every 6 (six) hours as needed for moderate  pain.  03/29/16  Yes Historical Provider, MD  metFORMIN (GLUCOPHAGE-XR) 750 MG 24 hr tablet Take 1,500 mg by mouth at bedtime.    Yes Historical Provider, MD  Multiple Vitamin (MULTIVITAMIN WITH MINERALS) TABS Take 1 tablet by mouth daily.   Yes Historical Provider, MD  simvastatin (ZOCOR) 40 MG tablet Take 40 mg by mouth daily.    Yes Historical Provider, MD  telmisartan (MICARDIS) 80 MG tablet Take 80 mg by mouth every morning.    Yes Historical Provider, MD    Family History Family History  Problem Relation Age of Onset  . Brain cancer Brother     deceased age 30  . Lung cancer Brother     deceased age 31  . Colon cancer Neg Hx   . Diabetes Mother   . Diabetes Father   . Heart disease Mother     CHF, deceased 61  . Kidney failure Father     deceased 48    Social History Social History  Substance Use Topics  . Smoking status: Never Smoker  . Smokeless tobacco: Never Used  . Alcohol use No     Allergies   Amoxicillin; Hydrocodone; Penicillins; and Percocet [oxycodone-acetaminophen]   Review of Systems Review of Systems ROS reviewed and all are negative for acute change except as noted in the HPI.  Physical Exam Updated Vital Signs BP 111/94 (BP Location: Left Arm)  Pulse 84   Temp 97.7 F (36.5 C) (Oral)   Resp 16   Ht 5\' 5"  (1.651 m)   Wt 81.6 kg   SpO2 94%   BMI 29.95 kg/m   Physical Exam  Constitutional: He is oriented to person, place, and time. Vital signs are normal. He appears well-developed and well-nourished.  Sitting up on bed. Conversing without discomfort.   HENT:  Head: Normocephalic and atraumatic.  Right Ear: Hearing normal.  Left Ear: Hearing normal.  Eyes: Conjunctivae and EOM are normal. Pupils are equal, round, and reactive to light.  Neck: Normal range of motion. Neck supple.  Cardiovascular: Normal rate, regular rhythm, normal heart sounds and intact distal pulses.   Pulmonary/Chest: Effort normal and breath sounds normal. No  respiratory distress. He has no wheezes.  Abdominal: Soft. Normal appearance and bowel sounds are normal. There is no tenderness. There is no rigidity, no rebound, no guarding, no CVA tenderness, no tenderness at McBurney's point and negative Murphy's sign.  Abdomen soft. No focal tenderness   Musculoskeletal: Normal range of motion.  Neurological: He is alert and oriented to person, place, and time.  Skin: Skin is warm and dry.  Psychiatric: He has a normal mood and affect. His speech is normal and behavior is normal. Thought content normal.  Nursing note and vitals reviewed.  ED Treatments / Results  Labs (all labs ordered are listed, but only abnormal results are displayed) Labs Reviewed  COMPREHENSIVE METABOLIC PANEL - Abnormal; Notable for the following:       Result Value   Glucose, Bld 149 (*)    Calcium 8.8 (*)    All other components within normal limits  CBC WITH DIFFERENTIAL/PLATELET - Abnormal; Notable for the following:    RBC 5.85 (*)    MCV 70.9 (*)    MCH 22.7 (*)    All other components within normal limits  LIPASE, BLOOD  URINALYSIS, ROUTINE W REFLEX MICROSCOPIC    EKG  EKG Interpretation None       Radiology No results found.  Procedures Procedures (including critical care time)  Medications Ordered in ED Medications - No data to display   Initial Impression / Assessment and Plan / ED Course  I have reviewed the triage vital signs and the nursing notes.  Pertinent labs & imaging results that were available during my care of the patient were reviewed by me and considered in my medical decision making (see chart for details).  Final Clinical Impressions(s) / ED Diagnoses  {I have reviewed and evaluated the relevant laboratory values.   {I have reviewed the relevant previous healthcare records.  {I obtained HPI from historian.   ED Course:  Assessment: Patient is a 65yM presents with abdominal pain with diarrhea onset yesterday. Notes after eating  peanuts and taking robitussin. Diarrhea resolving. Pain is periumbilical and RLQ. Has not taken OTC remedies other than robitussin. On exam, nontoxic, nonseptic appearing, in no apparent distress. Patient's pain and other symptoms adequately managed in emergency department.  Labs and vitals reviewed.  Patient does not meet the SIRS or Sepsis criteria.  On repeat exam patient does not have a surgical abdomen and there are no peritoneal signs.  No indication of appendicitis as he has had appendectomy, bowel obstruction, bowel perforation, cholecystitis, diverticulitis. Possible mild colitis from diarrhea. Doubt acute intraabdominal pathology given presentation and lab work. Patient discharged home with symptomatic treatment and given strict instructions for follow-up with their primary care physician.  I have also discussed  reasons to return immediately to the ER.  Patient expresses understanding and agrees with plan.  Disposition/Plan:  DC Home Additional Verbal discharge instructions given and discussed with patient.  Pt Instructed to f/u with PCP in the next week for evaluation and treatment of symptoms. Return precautions given Pt acknowledges and agrees with plan  Supervising Physician Nira ConnPedro Eduardo Cardama, MD  Final diagnoses:  Abdominal pain, unspecified abdominal location    New Prescriptions New Prescriptions   No medications on file     Audry Piliyler Oriel Ojo, PA-C 01/01/17 [address]    Pedro Eduardo Berry Collegeardama, [place] CarolinaMD 01/01/17 1743

## 2017-01-01 NOTE — ED Notes (Signed)
Signing pad not working at this time. Pt verbalized understanding of all discharge instructions and stated he had no further questions.

## 2017-03-05 ENCOUNTER — Emergency Department (HOSPITAL_COMMUNITY)
Admission: EM | Admit: 2017-03-05 | Discharge: 2017-03-05 | Disposition: A | Discharge status: Home / Self Care | Payer: BLUE CROSS/BLUE SHIELD | Attending: Emergency Medicine | Admitting: Emergency Medicine

## 2017-03-05 ENCOUNTER — Encounter (HOSPITAL_COMMUNITY): Payer: Self-pay | Admitting: *Deleted

## 2017-03-05 ENCOUNTER — Emergency Department (HOSPITAL_COMMUNITY): Payer: BLUE CROSS/BLUE SHIELD

## 2017-03-05 DIAGNOSIS — I10 Essential (primary) hypertension: Secondary | ICD-10-CM | POA: Insufficient documentation

## 2017-03-05 DIAGNOSIS — Z7984 Long term (current) use of oral hypoglycemic drugs: Secondary | ICD-10-CM | POA: Insufficient documentation

## 2017-03-05 DIAGNOSIS — M6208 Separation of muscle (nontraumatic), other site: Secondary | ICD-10-CM | POA: Insufficient documentation

## 2017-03-05 DIAGNOSIS — Z7982 Long term (current) use of aspirin: Secondary | ICD-10-CM | POA: Diagnosis not present

## 2017-03-05 DIAGNOSIS — E119 Type 2 diabetes mellitus without complications: Secondary | ICD-10-CM | POA: Insufficient documentation

## 2017-03-05 DIAGNOSIS — R1010 Upper abdominal pain, unspecified: Secondary | ICD-10-CM

## 2017-03-05 DIAGNOSIS — R1011 Right upper quadrant pain: Secondary | ICD-10-CM | POA: Diagnosis present

## 2017-03-05 LAB — CBC WITH DIFFERENTIAL/PLATELET
BASOS PCT: 0 %
Basophils Absolute: 0 10*3/uL (ref 0.0–0.1)
EOS ABS: 0.1 10*3/uL (ref 0.0–0.7)
Eosinophils Relative: 1 %
HCT: 42.8 % (ref 39.0–52.0)
Hemoglobin: 13.8 g/dL (ref 13.0–17.0)
LYMPHS ABS: 2.1 10*3/uL (ref 0.7–4.0)
Lymphocytes Relative: 26 %
MCH: 23.1 pg — ABNORMAL LOW (ref 26.0–34.0)
MCHC: 32.2 g/dL (ref 30.0–36.0)
MCV: 71.6 fL — ABNORMAL LOW (ref 78.0–100.0)
Monocytes Absolute: 0.8 10*3/uL (ref 0.1–1.0)
Monocytes Relative: 11 %
NEUTROS PCT: 62 %
Neutro Abs: 4.8 10*3/uL (ref 1.7–7.7)
PLATELETS: 256 10*3/uL (ref 150–400)
RBC: 5.98 MIL/uL — AB (ref 4.22–5.81)
RDW: 14.3 % (ref 11.5–15.5)
WBC: 7.8 10*3/uL (ref 4.0–10.5)

## 2017-03-05 LAB — I-STAT CHEM 8, ED
BUN: 9 mg/dL (ref 6–20)
CHLORIDE: 98 mmol/L — AB (ref 101–111)
Calcium, Ion: 1.13 mmol/L — ABNORMAL LOW (ref 1.15–1.40)
Creatinine, Ser: 1 mg/dL (ref 0.61–1.24)
Glucose, Bld: 108 mg/dL — ABNORMAL HIGH (ref 65–99)
HCT: 48 % (ref 39.0–52.0)
Hemoglobin: 16.3 g/dL (ref 13.0–17.0)
POTASSIUM: 4 mmol/L (ref 3.5–5.1)
SODIUM: 140 mmol/L (ref 135–145)
TCO2: 30 mmol/L (ref 0–100)

## 2017-03-05 LAB — COMPREHENSIVE METABOLIC PANEL
ALK PHOS: 64 U/L (ref 38–126)
ALT: 20 U/L (ref 17–63)
ANION GAP: 9 (ref 5–15)
AST: 25 U/L (ref 15–41)
Albumin: 4.5 g/dL (ref 3.5–5.0)
BUN: 10 mg/dL (ref 6–20)
CALCIUM: 9.3 mg/dL (ref 8.9–10.3)
CO2: 29 mmol/L (ref 22–32)
Chloride: 100 mmol/L — ABNORMAL LOW (ref 101–111)
Creatinine, Ser: 0.99 mg/dL (ref 0.61–1.24)
GFR calc non Af Amer: >60 mL/min (ref >60)
Glucose, Bld: 111 mg/dL — ABNORMAL HIGH (ref 65–99)
Potassium: 4.1 mmol/L (ref 3.5–5.1)
SODIUM: 138 mmol/L (ref 135–145)
TOTAL PROTEIN: 8 g/dL (ref 6.5–8.1)
Total Bilirubin: 1.1 mg/dL (ref 0.3–1.2)

## 2017-03-05 LAB — LIPASE, BLOOD: Lipase: 16 U/L (ref 11–51)

## 2017-03-05 MED ORDER — ACETAMINOPHEN 500 MG PO TABS: 500.0000 mg | ORAL_TABLET | Freq: Four times a day (QID) | ORAL | 0 refills | Status: AC | PRN

## 2017-03-05 MED ORDER — IOPAMIDOL (ISOVUE-300) INJECTION 61%
100.0000 mL | Freq: Once | INTRAVENOUS | Status: AC | PRN
  Administered 2017-03-05: 100 mL via INTRAVENOUS

## 2017-03-05 MED ORDER — IOPAMIDOL (ISOVUE-300) INJECTION 61%
INTRAVENOUS | Status: AC
  Filled 2017-03-05: qty 100

## 2017-03-05 NOTE — ED Provider Notes (Signed)
[facility]-EMERGENCY DEPT Provider Note   CSN: [id number] Arrival date & time: 03/05/17  1530     History   Chief Complaint Chief Complaint  Patient presents with  . Hernia    HPI JAMYSON JIRAK is a 66 y.o. male.  ADDAM GOELLER is a 66 y.o. Male who presents to the ED after being sent by PCP office for possible hernia. Patient reports he's had painful area to his right upper quadrant for several weeks that has worsened over the past several days. Reports his pain is in his right upper quadrant. He reports when he lays flat he seems to feel the area better. He was seen by his PCP today who sent him to the emergency department. He denies any nausea, vomiting or diarrhea. He denies any constipation or trouble with bowel movements. He has been passing gas without difficulty. He reports a previous abdominal surgical history of an appendectomy. He denies fevers, nausea, vomiting, diarrhea, lower abdominal pain, penile pain, testicular pain, urinary symptoms, chest pain, or rashes.   The history is provided by the patient and medical records. No language interpreter was used.    Past Medical History:  Diagnosis Date  . Diabetes mellitus   . GERD (gastroesophageal reflux disease)   . HLD (hyperlipidemia)   . Hydrocele   . Hypertension     Patient Active Problem List   Diagnosis Date Noted  . Hypertension 02/22/2011  . Diabetes mellitus type II 02/22/2011  . Anemia, unspecified 02/22/2011  . Enteritis 02/22/2011    Past Surgical History:  Procedure Laterality Date  . APPENDECTOMY    . HEMORRHOID SURGERY         Home Medications    Prior to Admission medications   Medication Sig Start Date End Date Taking? Authorizing Provider  aspirin EC 81 MG tablet Take 81 mg by mouth every morning.    Yes Historical Provider, MD  folic acid (FOLVITE) 1 MG tablet TAKE ONE TABLET BY MOUTH ONCE DAILY Patient taking differently: TAKE 1 MG BY MOUTH EACH MORNING 10/31/16  Yes Brand Males  [place], NP  glipiZIDE (GLUCOTROL XL) 10 MG 24 hr tablet Take 10 mg by mouth every morning.    Yes Historical Provider, MD  hydrochlorothiazide (HYDRODIURIL) 25 MG tablet Take 25 mg by mouth every morning. 03/03/17  Yes Historical Provider, MD  metFORMIN (GLUCOPHAGE-XR) 750 MG 24 hr tablet Take 1,500 mg by mouth at bedtime.    Yes Historical Provider, MD  Multiple Vitamin (MULTIVITAMIN WITH MINERALS) TABS Take 1 tablet by mouth every morning.    Yes Historical Provider, MD  simvastatin (ZOCOR) 40 MG tablet Take 40 mg by mouth every morning.    Yes Historical Provider, MD  telmisartan (MICARDIS) 80 MG tablet Take 80 mg by mouth every morning.    Yes Historical Provider, MD  acetaminophen (TYLENOL) 500 MG tablet Take 1 tablet (500 mg total) by mouth every 6 (six) hours as needed for mild pain. 03/05/17   Everlene Farrier, PA-C  naproxen (NAPROSYN) 500 MG tablet Take 1 tablet (500 mg total) by mouth 2 (two) times daily. Patient not taking: Reported on 03/05/2017 01/01/17   Audry Pili, PA-C    Family History Family History  Problem Relation Age of Onset  . Brain cancer Brother     deceased age 57  . Lung cancer Brother     deceased age 47  . Diabetes Mother   . Heart disease Mother     CHF, deceased 78  .  Diabetes Father   . Kidney failure Father     deceased 33  . Colon cancer Neg Hx     Social History Social History  Substance Use Topics  . Smoking status: Never Smoker  . Smokeless tobacco: Never Used  . Alcohol use No     Allergies   Amoxicillin; Hydrocodone; Penicillins; and Percocet [oxycodone-acetaminophen]   Review of Systems Review of Systems  Constitutional: Negative for chills and fever.  HENT: Negative for congestion and sore throat.   Eyes: Negative for visual disturbance.  Respiratory: Negative for cough and shortness of breath.   Cardiovascular: Negative for chest pain.  Gastrointestinal: Positive for abdominal pain. Negative for blood in stool, constipation,  diarrhea, nausea and vomiting.  Genitourinary: Negative for decreased urine volume, difficulty urinating, dysuria, frequency, hematuria, penile pain and testicular pain.  Musculoskeletal: Negative for back pain and neck pain.  Skin: Negative for rash.  Neurological: Negative for light-headedness and headaches.     Physical Exam Updated Vital Signs BP (!) 151/94 (BP Location: Right Arm)   Pulse 81   Temp 97.7 F (36.5 C) (Oral)   Resp 18   SpO2 100%   Physical Exam  Constitutional: He appears well-developed and well-nourished. No distress.  Nontoxic-appearing. Obese.  HENT:  Head: Normocephalic and atraumatic.  Mouth/Throat: Oropharynx is clear and moist.  Eyes: Conjunctivae are normal. Pupils are equal, round, and reactive to light. Right eye exhibits no discharge. Left eye exhibits no discharge.  Neck: Neck supple.  Cardiovascular: Normal rate, regular rhythm, normal heart sounds and intact distal pulses.  Exam reveals no gallop and no friction rub.   No murmur heard. Pulmonary/Chest: Effort normal and breath sounds normal. No respiratory distress. He has no wheezes. He has no rales.  Abdominal: Soft. Bowel sounds are normal. He exhibits no distension and no mass. There is tenderness. There is no rebound and no guarding.  Abdomen is soft. Bowel sounds are present. Patient has slight ventral hernia vs diastasis recti present. He points to pain over his right upper quadrant and over this ventral hernia.  No obvious hernia to RUQ. No Murphy's sign. No lower abdominal tenderness to palpation. No CVA or flank TTP. No umbilical hernia.   Musculoskeletal: He exhibits no edema.  Lymphadenopathy:    He has no cervical adenopathy.  Neurological: He is alert. Coordination normal.  Skin: Skin is warm and dry. Capillary refill takes less than 2 seconds. No rash noted. He is not diaphoretic. No erythema. No pallor.  Psychiatric: He has a normal mood and affect. His behavior is normal.  Nursing  note and vitals reviewed.    ED Treatments / Results  Labs (all labs ordered are listed, but only abnormal results are displayed) Labs Reviewed  COMPREHENSIVE METABOLIC PANEL - Abnormal; Notable for the following:       Result Value   Chloride 100 (*)    Glucose, Bld 111 (*)    All other components within normal limits  CBC WITH DIFFERENTIAL/PLATELET - Abnormal; Notable for the following:    RBC 5.98 (*)    MCV 71.6 (*)    MCH 23.1 (*)    All other components within normal limits  I-STAT CHEM 8, ED - Abnormal; Notable for the following:    Chloride 98 (*)    Glucose, Bld 108 (*)    Calcium, Ion 1.13 (*)    All other components within normal limits  LIPASE, BLOOD    EKG  EKG Interpretation None  Radiology Ct Abdomen Pelvis W Contrast  Result Date: 03/05/2017 CLINICAL DATA:  66 y/o  M; right upper quadrant abdominal pain. EXAM: CT ABDOMEN AND PELVIS WITH CONTRAST TECHNIQUE: Multidetector CT imaging of the abdomen and pelvis was performed using the standard protocol following bolus administration of intravenous contrast. CONTRAST:  ISOVUE-300 IOPAMIDOL (ISOVUE-300) INJECTION 61% COMPARISON:  09/05/2015 CT of the abdomen and pelvis. FINDINGS: Lower chest: No acute abnormality. Hepatobiliary: Stable 20 mm lucent lesion within segment 8 of the liver (series 2, image 8) probably representing hemangioma or cyst. No other focal liver lesion identified. Normal gallbladder. No intra or extrahepatic biliary ductal dilatation. Pancreas: Unremarkable. No pancreatic ductal dilatation or surrounding inflammatory changes. Spleen: Normal in size without focal abnormality. Adrenals/Urinary Tract: Adrenal glands are unremarkable. Kidneys are normal, without renal calculi, focal lesion, or hydronephrosis. Bladder is unremarkable. Stable nonspecific perinephric stranding. Stomach/Bowel: Stomach is within normal limits. Appendix appears normal. No evidence of bowel wall thickening,  distention, or inflammatory changes. Vascular/Lymphatic: Aortic atherosclerosis. No enlarged abdominal or pelvic lymph nodes. Reproductive: Prostate is unremarkable. Other: No abdominal wall hernia or abnormality. No abdominopelvic ascites. Musculoskeletal: No acute fracture identified. Moderate lumbar spondylosis with endplate Schmorl's nodes of the L4 and L5 vertebral bodies and discogenic degenerative changes with prominent disc bulges from L3 through S1. Mild-to-moderate foraminal narrowing bilaterally at L4-5 and L5-S1. IMPRESSION: No acute process identified as explanation for pain. No abdominal wall hernia identified. Stable CT of abdomen and pelvis. Electronically Signed   By: Mitzi Hansen M.D.   On: 03/05/2017 21:01    Procedures Procedures (including critical care time)  Medications Ordered in ED Medications  iopamidol (ISOVUE-300) 61 % injection (not administered)  iopamidol (ISOVUE-300) 61 % injection 100 mL (100 mLs Intravenous Contrast Given 03/05/17 2027)     Initial Impression / Assessment and Plan / ED Course  I have reviewed the triage vital signs and the nursing notes.  Pertinent labs & imaging results that were available during my care of the patient were reviewed by me and considered in my medical decision making (see chart for details).    This is a 66 y.o. Male who presents to the ED after being sent by PCP office for possible hernia. Patient reports he's had painful area to his right upper quadrant for several weeks that has worsened over the past several days. Reports his pain is in his right upper quadrant. He reports when he lays flat he seems to feel the area better. He was seen by his PCP today who sent him to the emergency department. He denies any nausea, vomiting or diarrhea. He denies any constipation or trouble with bowel movements. He has been passing gas without difficulty. He reports a previous abdominal surgical history of an appendectomy. On exam  patient is afebrile nontoxic appearing. He points to pain overlying the ventral hernia as well as his right upper quadrant. Patient has evidence of a ventral hernia versus diastasis recti on exam. There is no overlying surgical incision over the ventral aspect of his abdomen. Only previous abdominal surgical history is an appendectomy and patient has right lower quadrant incision site there. No umbilical hernia. Mild tenderness over his right upper quadrant. No palpable hernia. No Murphy sign. CMP, lipase and CBC are unremarkable. CT abdomen pelvis with contrast was obtained which showed no evidence of hernia or acute abdominal pathology on CT scan. At reevaluation I questioned the patient further about his pain. He points to pain over the area of the diastasis  recti. He reports his pain only began after this was pointed out by primary care. He denies any right upper quadrant abdominal pain currently. He's had no nausea, vomiting or diarrhea. He doesn't he's really concerned about this ventral hernia. He denies having pain in his right upper quadrant, however I palpated some tenderness on my initial exam. Patient appears to have diastases recti. I reassured the patient that this is not concerning in the typical finding for male around his age. No evidence of hernia on CT scan. He is tolerating by mouth. Will discharge at this time and have him follow-up with primary care. Tylenol as needed for pain. I advised the patient to follow-up with their primary care provider this week. I advised the patient to return to the emergency department with new or worsening symptoms or new concerns. The patient verbalized understanding and agreement with plan.     Final Clinical Impressions(s) / ED Diagnoses   Final diagnoses:  Diastasis recti  Pain of upper abdomen    New Prescriptions New Prescriptions   ACETAMINOPHEN (TYLENOL) 500 MG TABLET    Take 1 tablet (500 mg total) by mouth every 6 (six) hours as needed for  mild pain.     Everlene Farrier, PA-C 03/05/17 2137    Marily Memos, MD 03/05/17 2225

## 2017-03-05 NOTE — ED Triage Notes (Signed)
Pt complains of painful hernia to upper abdomin for the past couple of days. Pt was to told to come to ED for evaluation.

## 2017-03-05 NOTE — Discharge Instructions (Signed)
A diastasis recti may appear as a ridge running down the midline of the abdomen, anywhere from the xiphoid process to the umbilicus. It becomes more prominent with straining and may disappear when the abdominal muscles are relaxed. The medial borders of the right and left halves of the muscle may be palpated during contraction of the rectus abdominis.

## 2017-06-04 ENCOUNTER — Other Ambulatory Visit: Payer: Self-pay | Admitting: Family

## 2017-06-04 DIAGNOSIS — D649 Anemia, unspecified: Secondary | ICD-10-CM

## 2017-07-30 ENCOUNTER — Emergency Department (HOSPITAL_COMMUNITY): Payer: BLUE CROSS/BLUE SHIELD

## 2017-07-30 ENCOUNTER — Encounter (HOSPITAL_COMMUNITY): Payer: Self-pay | Admitting: Emergency Medicine

## 2017-07-30 ENCOUNTER — Emergency Department (HOSPITAL_COMMUNITY)
Admission: EM | Admit: 2017-07-30 | Discharge: 2017-07-30 | Disposition: A | Discharge status: Home / Self Care | Payer: BLUE CROSS/BLUE SHIELD | Attending: Emergency Medicine | Admitting: Emergency Medicine

## 2017-07-30 DIAGNOSIS — E119 Type 2 diabetes mellitus without complications: Secondary | ICD-10-CM | POA: Diagnosis not present

## 2017-07-30 DIAGNOSIS — Z7984 Long term (current) use of oral hypoglycemic drugs: Secondary | ICD-10-CM | POA: Insufficient documentation

## 2017-07-30 DIAGNOSIS — Z79899 Other long term (current) drug therapy: Secondary | ICD-10-CM | POA: Diagnosis not present

## 2017-07-30 DIAGNOSIS — K2970 Gastritis, unspecified, without bleeding: Secondary | ICD-10-CM | POA: Diagnosis not present

## 2017-07-30 DIAGNOSIS — I10 Essential (primary) hypertension: Secondary | ICD-10-CM | POA: Insufficient documentation

## 2017-07-30 DIAGNOSIS — Z7982 Long term (current) use of aspirin: Secondary | ICD-10-CM | POA: Diagnosis not present

## 2017-07-30 DIAGNOSIS — R1084 Generalized abdominal pain: Secondary | ICD-10-CM

## 2017-07-30 LAB — COMPREHENSIVE METABOLIC PANEL
ALBUMIN: 4.1 g/dL (ref 3.5–5.0)
ALK PHOS: 65 U/L (ref 38–126)
ALT: 21 U/L (ref 17–63)
ANION GAP: 8 (ref 5–15)
AST: 22 U/L (ref 15–41)
BILIRUBIN TOTAL: 1.3 mg/dL — AB (ref 0.3–1.2)
BUN: 16 mg/dL (ref 6–20)
CALCIUM: 9.2 mg/dL (ref 8.9–10.3)
CO2: 28 mmol/L (ref 22–32)
Chloride: 102 mmol/L (ref 101–111)
Creatinine, Ser: 1.12 mg/dL (ref 0.61–1.24)
Glucose, Bld: 112 mg/dL — ABNORMAL HIGH (ref 65–99)
POTASSIUM: 4.3 mmol/L (ref 3.5–5.1)
Sodium: 138 mmol/L (ref 135–145)
TOTAL PROTEIN: 7.7 g/dL (ref 6.5–8.1)

## 2017-07-30 LAB — CBC
HCT: 41.6 % (ref 39.0–52.0)
HEMOGLOBIN: 13.5 g/dL (ref 13.0–17.0)
MCH: 23.3 pg — ABNORMAL LOW (ref 26.0–34.0)
MCHC: 32.5 g/dL (ref 30.0–36.0)
MCV: 71.8 fL — ABNORMAL LOW (ref 78.0–100.0)
Platelets: 292 10*3/uL (ref 150–400)
RBC: 5.79 MIL/uL (ref 4.22–5.81)
RDW: 14.5 % (ref 11.5–15.5)
WBC: 5.2 10*3/uL (ref 4.0–10.5)

## 2017-07-30 LAB — URINALYSIS, ROUTINE W REFLEX MICROSCOPIC
BILIRUBIN URINE: NEGATIVE
Glucose, UA: NEGATIVE mg/dL
Hgb urine dipstick: NEGATIVE
KETONES UR: NEGATIVE mg/dL
LEUKOCYTES UA: NEGATIVE
NITRITE: NEGATIVE
Protein, ur: NEGATIVE mg/dL
SPECIFIC GRAVITY, URINE: 1.036 — AB (ref 1.005–1.030)
pH: 6 (ref 5.0–8.0)

## 2017-07-30 LAB — LIPASE, BLOOD: Lipase: 20 U/L (ref 11–51)

## 2017-07-30 MED ORDER — IOPAMIDOL (ISOVUE-300) INJECTION 61%
100.0000 mL | Freq: Once | INTRAVENOUS | Status: AC | PRN
  Administered 2017-07-30: 100 mL via INTRAVENOUS

## 2017-07-30 MED ORDER — IOPAMIDOL (ISOVUE-300) INJECTION 61%
INTRAVENOUS | Status: AC
  Administered 2017-07-30: 100 mL via INTRAVENOUS
  Filled 2017-07-30: qty 100

## 2017-07-30 MED ORDER — OMEPRAZOLE 20 MG PO CPDR: 20.0000 mg | DELAYED_RELEASE_CAPSULE | Freq: Every day | ORAL | 0 refills | Status: DC

## 2017-07-30 NOTE — ED Triage Notes (Signed)
Patient complaining of abdominal pain. Patient states the pain comes and goes since Monday. Patient states the pain is mid lower abdominal area.

## 2017-07-30 NOTE — ED Notes (Signed)
Patient given urinal to provide urine speciman

## 2017-07-30 NOTE — ED Provider Notes (Signed)
[facility]ER[facility] DEPT Provider Note   CSN: [id number] Arrival date & time: 07/30/17  [id number]     History   Chief Complaint Chief Complaint  Patient presents with  . Abdominal Pain    HPI James [name] is a 66 y.o. male.  The history is provided by the patient. No language interpreter was used.  Abdominal Pain   This is a new problem. Episode onset: 3 days ago. The problem occurs constantly. The problem has been gradually worsening. The pain is associated with eating. The pain is located in the RUQ, RLQ and LLQ. The pain is moderate. Associated symptoms include anorexia. Pertinent negatives include diarrhea and nausea. Nothing aggravates the symptoms. Nothing relieves the symptoms. Past workup does not include GI consult. His past medical history is significant for GERD.  Pt complains of some occasional reflux with eating.  Pt reports pain is crampy and comes and goes.   Past Medical History:  Diagnosis Date  . Diabetes mellitus   . GERD (gastroesophageal reflux disease)   . HLD (hyperlipidemia)   . Hydrocele   . Hypertension     Patient Active Problem List   Diagnosis Date Noted  . Hypertension 02/22/2011  . Diabetes mellitus type II 02/22/2011  . Anemia, unspecified 02/22/2011  . Enteritis 02/22/2011    Past Surgical History:  Procedure Laterality Date  . APPENDECTOMY    . HEMORRHOID SURGERY         Home Medications    Prior to Admission medications   Medication Sig Start Date End Date Taking? Authorizing Provider  aspirin EC 81 MG tablet Take 81 mg by mouth every morning.    Yes [provider]  folic acid (FOLVITE) 1 MG tablet TAKE ONE TABLET BY MOUTH ONCE DAILY 06/04/17  Yes [place], Brand MalesSarah M, NP  glipiZIDE (GLUCOTROL XL) 10 MG 24 hr tablet Take 10 mg by mouth every morning.    Yes [provider]  hydrochlorothiazide (HYDRODIURIL) 25 MG tablet Take 25 mg by mouth every morning. 03/03/17  Yes [provider]  metFORMIN (GLUCOPHAGE-XR)  750 MG 24 hr tablet Take 1,500 mg by mouth at bedtime.    Yes [provider]  Multiple Vitamin (MULTIVITAMIN WITH MINERALS) TABS Take 1 tablet by mouth every morning.    Yes [provider]  simvastatin (ZOCOR) 40 MG tablet Take 40 mg by mouth every morning.    Yes [provider]  telmisartan (MICARDIS) 80 MG tablet Take 80 mg by mouth every morning.    Yes [provider]  acetaminophen (TYLENOL) 500 MG tablet Take 1 tablet (500 mg total) by mouth every 6 (six) hours as needed for mild pain. Patient not taking: Reported on 07/30/2017 03/05/17   Everlene Farrieransie, William, PA-C  naproxen (NAPROSYN) 500 MG tablet Take 1 tablet (500 mg total) by mouth 2 (two) times daily. Patient not taking: Reported on 03/05/2017 01/01/17   Audry PiliMohr, [place], PA-C  omeprazole (PRILOSEC) 20 MG capsule Take 1 capsule (20 mg total) by mouth daily. 07/30/17   Elson AreasSofia, Leslie K, PA-C    Family History Family History  Problem Relation Age of Onset  . Brain cancer Brother        deceased age 64  . Lung cancer Brother        deceased age 765  . Diabetes Mother   . Heart disease Mother        CHF, deceased [id number]  . Diabetes Father   . Kidney failure Father  deceased 70  . Colon cancer Neg Hx     Social History Social History  Substance Use Topics  . Smoking status: Never Smoker  . Smokeless tobacco: Never Used  . Alcohol use No     Allergies   Amoxicillin; Hydrocodone; Penicillins; and Percocet [oxycodone-acetaminophen]   Review of Systems Review of Systems  Gastrointestinal: Positive for abdominal pain and anorexia. Negative for diarrhea and nausea.  All other systems reviewed and are negative.    Physical Exam Updated Vital Signs BP 123/87 (BP Location: Right Arm)   Pulse 78   Temp 98.5 F (36.9 C) (Oral)   Resp 18   Ht 5\' 5"  (1.651 m)   Wt 81.6 kg (180 lb)   SpO2 99%   BMI 29.95 kg/m   Physical Exam  Constitutional: He appears well-developed and well-nourished.    HENT:  Head: Normocephalic and atraumatic.  Right Ear: External ear normal.  Left Ear: External ear normal.  Mouth/Throat: Oropharynx is clear and moist.  Eyes: Conjunctivae are normal.  Neck: Neck supple.  Cardiovascular: Normal rate and regular rhythm.   No murmur heard. Pulmonary/Chest: Effort normal and breath sounds normal. No respiratory distress.  Abdominal: Soft. There is no tenderness.  Musculoskeletal: He exhibits no edema.  Neurological: He is alert.  Skin: Skin is warm and dry.  Psychiatric: He has a normal mood and affect.  Nursing note and vitals reviewed.    ED Treatments / Results  Labs (all labs ordered are listed, but only abnormal results are displayed) Labs Reviewed  COMPREHENSIVE METABOLIC PANEL - Abnormal; Notable for the following:       Result Value   Glucose, Bld 112 (*)    Total Bilirubin 1.3 (*)    All other components within normal limits  CBC - Abnormal; Notable for the following:    MCV 71.8 (*)    MCH 23.3 (*)    All other components within normal limits  URINALYSIS, ROUTINE W REFLEX MICROSCOPIC - Abnormal; Notable for the following:    Specific Gravity, Urine 1.036 (*)    All other components within normal limits  LIPASE, BLOOD    EKG  EKG Interpretation None       Radiology Ct Abdomen Pelvis W Contrast  Result Date: 07/30/2017 CLINICAL DATA:  Patient complaining of abdominal pain. Patient states the pain comes and goes since Monday. Patient states the pain is mid lower abdominal area. Diabetic-metformin EXAM: CT ABDOMEN AND PELVIS WITH CONTRAST TECHNIQUE: Multidetector CT imaging of the abdomen and pelvis was performed using the standard protocol following bolus administration of intravenous contrast. CONTRAST:  100 ml isovue 300  Bun 16 cr 1.1 07-30-2017 GFR >60 COMPARISON:  03/05/2017 and previous FINDINGS: Lower chest: Coronary calcifications. No pleural or pericardial effusion. Visualized lung bases clear. Hepatobiliary: Stable  benign cystic lesion in hepatic segment 8 since 04/28/2010. No new liver lesion. Gallbladder is nondistended, unremarkable. No biliary dilatation. Pancreas: Unremarkable. No pancreatic ductal dilatation or surrounding inflammatory changes. Spleen: Normal in size without focal abnormality. Adrenals/Urinary Tract: Adrenal glands are unremarkable. Kidneys are normal, without renal calculi, focal lesion, or hydronephrosis. Bladder is unremarkable. Stomach/Bowel: Stomach is within normal limits. Appendix appears normal. No evidence of bowel wall thickening, distention, or inflammatory changes. Vascular/Lymphatic: Mild scattered aortoiliac calcified plaque without aneurysm or stenosis. Portal vein patent. Bilateral pelvic phleboliths. No abdominal or pelvic adenopathy. Reproductive: Prostate is unremarkable. Other: No ascites.  No free air. Musculoskeletal: Stable Schmorl's nodes L4-S1. Negative for fracture or worrisome bone lesion. IMPRESSION:  1. No acute findings. 2. Coronary and aortoiliac  Atherosclerosis (ICD10-170.0) Electronically Signed   By: Corlis Leak M.D.   On: 07/30/2017 08:41    Procedures Procedures (including critical care time)  Medications Ordered in ED Medications  iopamidol (ISOVUE-300) 61 % injection 100 mL (100 mLs Intravenous Contrast Given 07/30/17 0814)     Initial Impression / Assessment and Plan / ED Course  I have reviewed the triage vital signs and the nursing notes.  Pertinent labs & imaging results that were available during my care of the patient were reviewed by me and considered in my medical decision making (see chart for details).       Final Clinical Impressions(s) / ED Diagnoses   Final diagnoses:  Generalized abdominal pain  Gastritis without bleeding, unspecified chronicity, unspecified gastritis type    New Prescriptions Discharge Medication List as of 07/30/2017 11:04 AM    START taking these medications   Details  omeprazole (PRILOSEC) 20 MG capsule  Take 1 capsule (20 mg total) by mouth daily., Starting Wed 07/30/2017, Print      An After Visit Summary was printed and given to the patient. Pt advised 2 day recheck with his primary.   Pt is advised to return if symptoms worsen or change.   Elson Areas, PA-C 07/30/17 1455    Shaune Pollack, MD 07/31/17 1128

## 2017-07-30 NOTE — Discharge Instructions (Signed)
Return if any problems.  See your Physician for recheck if any problems.  

## 2017-07-30 NOTE — ED Notes (Signed)
Requested urine from patient. 

## 2017-08-17 ENCOUNTER — Emergency Department (HOSPITAL_COMMUNITY)
Admission: EM | Admit: 2017-08-17 | Discharge: 2017-08-17 | Disposition: A | Discharge status: Home / Self Care | Payer: BLUE CROSS/BLUE SHIELD | Attending: Emergency Medicine | Admitting: Emergency Medicine

## 2017-08-17 ENCOUNTER — Encounter (HOSPITAL_COMMUNITY): Payer: Self-pay | Admitting: *Deleted

## 2017-08-17 DIAGNOSIS — R1033 Periumbilical pain: Secondary | ICD-10-CM | POA: Diagnosis not present

## 2017-08-17 DIAGNOSIS — Z7982 Long term (current) use of aspirin: Secondary | ICD-10-CM | POA: Diagnosis not present

## 2017-08-17 DIAGNOSIS — Z7984 Long term (current) use of oral hypoglycemic drugs: Secondary | ICD-10-CM | POA: Insufficient documentation

## 2017-08-17 DIAGNOSIS — Z885 Allergy status to narcotic agent status: Secondary | ICD-10-CM | POA: Diagnosis not present

## 2017-08-17 DIAGNOSIS — Z79899 Other long term (current) drug therapy: Secondary | ICD-10-CM | POA: Insufficient documentation

## 2017-08-17 DIAGNOSIS — Z880 Allergy status to penicillin: Secondary | ICD-10-CM | POA: Insufficient documentation

## 2017-08-17 DIAGNOSIS — I10 Essential (primary) hypertension: Secondary | ICD-10-CM | POA: Insufficient documentation

## 2017-08-17 DIAGNOSIS — E119 Type 2 diabetes mellitus without complications: Secondary | ICD-10-CM | POA: Insufficient documentation

## 2017-08-17 LAB — URINALYSIS, ROUTINE W REFLEX MICROSCOPIC
Bilirubin Urine: NEGATIVE
GLUCOSE, UA: NEGATIVE mg/dL
Hgb urine dipstick: NEGATIVE
Ketones, ur: NEGATIVE mg/dL
LEUKOCYTES UA: NEGATIVE
NITRITE: NEGATIVE
PH: 6 (ref 5.0–8.0)
Protein, ur: NEGATIVE mg/dL
SPECIFIC GRAVITY, URINE: 1.006 (ref 1.005–1.030)

## 2017-08-17 LAB — CBC
HEMATOCRIT: 42.2 % (ref 39.0–52.0)
HEMOGLOBIN: 13.3 g/dL (ref 13.0–17.0)
MCH: 22.8 pg — ABNORMAL LOW (ref 26.0–34.0)
MCHC: 31.5 g/dL (ref 30.0–36.0)
MCV: 72.3 fL — AB (ref 78.0–100.0)
Platelets: 246 10*3/uL (ref 150–400)
RBC: 5.84 MIL/uL — AB (ref 4.22–5.81)
RDW: 14.1 % (ref 11.5–15.5)
WBC: 5.8 10*3/uL (ref 4.0–10.5)

## 2017-08-17 LAB — COMPREHENSIVE METABOLIC PANEL
ALBUMIN: 3.8 g/dL (ref 3.5–5.0)
ALT: 27 U/L (ref 17–63)
ANION GAP: 7 (ref 5–15)
AST: 26 U/L (ref 15–41)
Alkaline Phosphatase: 63 U/L (ref 38–126)
BUN: 12 mg/dL (ref 6–20)
CHLORIDE: 102 mmol/L (ref 101–111)
CO2: 28 mmol/L (ref 22–32)
Calcium: 9 mg/dL (ref 8.9–10.3)
Creatinine, Ser: 1.1 mg/dL (ref 0.61–1.24)
GFR calc Af Amer: >60 mL/min (ref >60)
Glucose, Bld: 102 mg/dL — ABNORMAL HIGH (ref 65–99)
POTASSIUM: 3.9 mmol/L (ref 3.5–5.1)
Sodium: 137 mmol/L (ref 135–145)
TOTAL PROTEIN: 6.7 g/dL (ref 6.5–8.1)
Total Bilirubin: 1.2 mg/dL (ref 0.3–1.2)

## 2017-08-17 LAB — LIPASE, BLOOD: LIPASE: 23 U/L (ref 11–51)

## 2017-08-17 MED ORDER — HYDROMORPHONE HCL 1 MG/ML IJ SOLN
1.0000 mg | Freq: Once | INTRAMUSCULAR | Status: DC
  Filled 2017-08-17: qty 1

## 2017-08-17 MED ORDER — GI COCKTAIL ~~LOC~~
30.0000 mL | Freq: Once | ORAL | Status: AC
  Administered 2017-08-17: 30 mL via ORAL
  Filled 2017-08-17: qty 30

## 2017-08-17 NOTE — ED Triage Notes (Signed)
Pt is here with lower abdominal pain and states normal bowel movement, no fever, no vomiting, but feels nausea.  No urinary symptoms and had a negative CT at [facility] last week per patient.

## 2017-08-17 NOTE — ED Notes (Signed)
Labs attempted by second RN

## 2017-08-17 NOTE — ED Notes (Addendum)
Pt state pain decreased. NAD at present

## 2017-08-17 NOTE — ED Provider Notes (Signed)
  Physical Exam  BP (!) 144/88   Pulse 63   Temp 97.8 F (36.6 C) (Oral)   Resp 16   SpO2 93%   Physical Exam  ED Course  Procedures  MDM  Assuming care of patient from Dr Rosalia Hammers  Patient in the ED for periumbilical abd pain, off and on. Workup thus far shows pending labs.  Concerning findings are as following none Important pending results are labs.  According to Dr. Rosalia Hammers, plan is to check labs, and if neg d/c.   Patient had no complains, no concerns from the nursing side. Will continue to monitor.  Abd exam is not surgical.     Derwood Kaplan, MD 08/17/17 1645

## 2017-08-17 NOTE — Discharge Instructions (Signed)
Please see your doctor and continue taking the meds prescribed. All ER results are normal.

## 2017-08-17 NOTE — ED Notes (Signed)
Iv attempted x 2 without success. Dr. Rosalia Hammers stating to get labs instead

## 2017-08-17 NOTE — ED Provider Notes (Signed)
[facility]-EMERGENCY DEPT Provider Note   CSN: [id number] Arrival date & time: 08/17/17  0906     History   Chief Complaint Chief Complaint  Patient presents with  . Abdominal Pain    HPI TEON HUDNALL is a 66 y.o. male.  HPI  66 year old man history of type 2 diabetes, GERD, hyperlipidemia, hypertension presents today complaining of abdominal pain that began on September 2. He states that it was crampy and diffuse in nature. It came and went. He could not describe any associated factors. There is nothing that makes it better or worse. He has not had any nausea, vomiting, diarrhea. In fact has had normal bowel movements. He states that he was seen at the was a long emergency department and started on omeprazole. The pain resolved until this morning at 2 AM when returned. He states that it waxes and wanes, he continues to be unable to identify any associated symptoms. There is nothing that appears to make it better or worse. He has not had any change in his appetite it, fever, chills, vomiting, or diarrhea. He denies any urinary tract infection symptoms such as increased frequency with urination, difficulty urinating, pain with urination, or blood in his urine. He has not had any similar symptoms in the past. He had an appendectomy many years ago and has had no other surgeries. Review of records reveals that he was seen in follow-up at [facility] on 921. They plan to continue omeprazole for 2 months and then stop.  Past Medical History:  Diagnosis Date  . Diabetes mellitus   . GERD (gastroesophageal reflux disease)   . HLD (hyperlipidemia)   . Hydrocele   . Hypertension     Patient Active Problem List   Diagnosis Date Noted  . Hypertension 02/22/2011  . Diabetes mellitus type II 02/22/2011  . Anemia, unspecified 02/22/2011  . Enteritis 02/22/2011    Past Surgical History:  Procedure Laterality Date  . APPENDECTOMY    . HEMORRHOID SURGERY         Home Medications    Prior  to Admission medications   Medication Sig Start Date End Date Taking? Authorizing Provider  acetaminophen (TYLENOL) 500 MG tablet Take 1 tablet (500 mg total) by mouth every 6 (six) hours as needed for mild pain. Patient not taking: Reported on 07/30/2017 03/05/17   Everlene Farrier, PA-C  aspirin EC 81 MG tablet Take 81 mg by mouth every morning.     [provider]  folic acid (FOLVITE) 1 MG tablet TAKE ONE TABLET BY MOUTH ONCE DAILY 06/04/17   [place], Brand Males, NP  glipiZIDE (GLUCOTROL XL) 10 MG 24 hr tablet Take 10 mg by mouth every morning.     [provider]  hydrochlorothiazide (HYDRODIURIL) 25 MG tablet Take 25 mg by mouth every morning. 03/03/17   [provider]  metFORMIN (GLUCOPHAGE-XR) 750 MG 24 hr tablet Take 1,500 mg by mouth at bedtime.     [provider]  Multiple Vitamin (MULTIVITAMIN WITH MINERALS) TABS Take 1 tablet by mouth every morning.     [provider]  naproxen (NAPROSYN) 500 MG tablet Take 1 tablet (500 mg total) by mouth 2 (two) times daily. Patient not taking: Reported on 03/05/2017 01/01/17   Audry Pili, PA-C  omeprazole (PRILOSEC) 20 MG capsule Take 1 capsule (20 mg total) by mouth daily. 07/30/17   Elson Areas, PA-C  simvastatin (ZOCOR) 40 MG tablet Take 40 mg by mouth every morning.     [provider]  telmisartan (MICARDIS) 80 MG tablet Take 80 mg by mouth every morning.     [provider]    Family History Family History  Problem Relation Age of Onset  . Brain cancer Brother        deceased age 42  . Lung cancer Brother        deceased age 74  . Diabetes Mother   . Heart disease Mother        CHF, deceased 15  . Diabetes Father   . Kidney failure Father        deceased 58  . Colon cancer Neg Hx     Social History Social History  Substance Use Topics  . Smoking status: Never Smoker  . Smokeless tobacco: Never Used  . Alcohol use No     Allergies   Amoxicillin; Hydrocodone;  Penicillins; and Percocet [oxycodone-acetaminophen]   Review of Systems Review of Systems  All other systems reviewed and are negative.    Physical Exam Updated Vital Signs BP (!) 146/97 (BP Location: Right Arm)   Pulse 74   Temp 97.8 F (36.6 C) (Oral)   Resp 18   SpO2 100%   Physical Exam  Constitutional: He is oriented to person, place, and time. He appears well-developed and well-nourished.  HENT:  Head: Normocephalic and atraumatic.  Right Ear: External ear normal.  Left Ear: External ear normal.  Nose: Nose normal.  Mouth/Throat: Oropharynx is clear and moist.  Eyes: Pupils are equal, round, and reactive to light. Conjunctivae and EOM are normal.  Neck: Normal range of motion. Neck supple.  Cardiovascular: Normal rate, regular rhythm, normal heart sounds and intact distal pulses.   Pulmonary/Chest: Effort normal and breath sounds normal.  Abdominal: Soft. Bowel sounds are normal. He exhibits no distension. There is no tenderness. There is no guarding.  Genitourinary: Penis normal. No penile tenderness.  Musculoskeletal: Normal range of motion.  Neurological: He is alert and oriented to person, place, and time. He has normal reflexes.  Skin: Skin is warm and dry.  Psychiatric: He has a normal mood and affect. His behavior is normal. Judgment and thought content normal.  Nursing note and vitals reviewed.    ED Treatments / Results  Labs (all labs ordered are listed, but only abnormal results are displayed) Labs Reviewed  LIPASE, BLOOD  COMPREHENSIVE METABOLIC PANEL  CBC  URINALYSIS, ROUTINE W REFLEX MICROSCOPIC  CBC WITH DIFFERENTIAL/PLATELET    EKG  EKG Interpretation  Date/Time:  Sunday August 17 2017 16:58:49 EDT Ventricular Rate:  62 PR Interval:    QRS Duration: 94 QT Interval:  401 QTC Calculation: 408 R Axis:   105 Text Interpretation:  Normal sinus rhythm Non-specific ST-t changes Confirmed by Margarita Grizzle [phone]) on 08/17/2017 5:10:42 PM         Radiology No results found.  Procedures Procedures (including critical care time)  Medications Ordered in ED Medications  HYDROmorphone (DILAUDID) injection 1 mg (not administered)  gi cocktail (Maalox,Lidocaine,Donnatal) (not administered)     Initial Impression / Assessment and Plan / ED Course  I have reviewed the triage vital signs and the nursing notes.  Pertinent labs & imaging results that were available during my care of the patient were reviewed by me and considered in my medical decision making (see chart for details).   this is a 66 year old man with. Umbilical pain but no tenderness to palpation on exam. He has had a CT scan within the past 2 weeks for the  same pain which was normal. He has no associated symptoms. He appears well here. Patient signed out to Dr. Rhunette Croft. Plan is to discharge patient to home after return of his labs. This is been discussed with the patient and he voices understanding of the plan and the need for follow-up with his primary care doctor. He is offered referral to GI but states that he would prefer to have his primary care doctor refer him.    Final Clinical Impressions(s) / ED Diagnoses   Final diagnoses:  Periumbilical pain    New Prescriptions New Prescriptions   No medications on file     Margarita Grizzle, MD 08/17/17 1711

## 2017-12-08 ENCOUNTER — Other Ambulatory Visit: Payer: Self-pay | Admitting: Family

## 2017-12-08 DIAGNOSIS — D649 Anemia, unspecified: Secondary | ICD-10-CM

## 2018-04-13 ENCOUNTER — Encounter (HOSPITAL_COMMUNITY): Payer: Self-pay

## 2018-04-13 ENCOUNTER — Other Ambulatory Visit: Payer: Self-pay

## 2018-04-13 ENCOUNTER — Emergency Department (HOSPITAL_COMMUNITY)
Admission: EM | Admit: 2018-04-13 | Discharge: 2018-04-13 | Disposition: A | Discharge status: Home / Self Care | Payer: BLUE CROSS/BLUE SHIELD | Attending: Emergency Medicine | Admitting: Emergency Medicine

## 2018-04-13 DIAGNOSIS — E119 Type 2 diabetes mellitus without complications: Secondary | ICD-10-CM | POA: Insufficient documentation

## 2018-04-13 DIAGNOSIS — R1032 Left lower quadrant pain: Secondary | ICD-10-CM | POA: Insufficient documentation

## 2018-04-13 DIAGNOSIS — Z79899 Other long term (current) drug therapy: Secondary | ICD-10-CM | POA: Insufficient documentation

## 2018-04-13 DIAGNOSIS — Z7982 Long term (current) use of aspirin: Secondary | ICD-10-CM | POA: Diagnosis not present

## 2018-04-13 DIAGNOSIS — R109 Unspecified abdominal pain: Secondary | ICD-10-CM

## 2018-04-13 DIAGNOSIS — Z7984 Long term (current) use of oral hypoglycemic drugs: Secondary | ICD-10-CM | POA: Diagnosis not present

## 2018-04-13 DIAGNOSIS — R1030 Lower abdominal pain, unspecified: Secondary | ICD-10-CM | POA: Diagnosis present

## 2018-04-13 DIAGNOSIS — I10 Essential (primary) hypertension: Secondary | ICD-10-CM | POA: Insufficient documentation

## 2018-04-13 LAB — URINALYSIS, ROUTINE W REFLEX MICROSCOPIC
Bilirubin Urine: NEGATIVE
Glucose, UA: NEGATIVE mg/dL
HGB URINE DIPSTICK: NEGATIVE
Ketones, ur: NEGATIVE mg/dL
LEUKOCYTES UA: NEGATIVE
Nitrite: NEGATIVE
Protein, ur: NEGATIVE mg/dL
SPECIFIC GRAVITY, URINE: 1.004 — AB (ref 1.005–1.030)
pH: 7 (ref 5.0–8.0)

## 2018-04-13 MED ORDER — CYCLOBENZAPRINE HCL 10 MG PO TABS: 5.0000 mg | ORAL_TABLET | Freq: Every day | ORAL | 0 refills | Status: AC

## 2018-04-13 NOTE — ED Provider Notes (Signed)
[facility]-EMERGENCY DEPT Provider Note  CSN: [id number] Arrival date & time: 04/13/18 1145  Chief Complaint(s) Flank Pain  HPI James [name] is a 67 y.o. male   The history is provided by the patient.  Flank Pain  This is a new problem. Episode onset: 4 days. The problem occurs constantly. Progression since onset: fluctuating. Pertinent negatives include no chest pain, no abdominal pain, no headaches and no shortness of breath. The symptoms are aggravated by twisting and bending. Nothing relieves the symptoms. He has tried acetaminophen for the symptoms. The treatment provided no relief.    Past Medical History Past Medical History:  Diagnosis Date  . Diabetes mellitus   . GERD (gastroesophageal reflux disease)   . HLD (hyperlipidemia)   . Hydrocele   . Hypertension    Patient Active Problem List   Diagnosis Date Noted  . Hypertension 02/22/2011  . Diabetes mellitus type II 02/22/2011  . Anemia, unspecified 02/22/2011  . Enteritis 02/22/2011   Home Medication(s) Prior to Admission medications   Medication Sig Start Date End Date Taking? Authorizing Provider  aspirin EC 81 MG tablet Take 81 mg by mouth daily.    Yes [provider]  ferrous sulfate 325 (65 FE) MG tablet Take 325 mg by mouth daily with breakfast.   Yes [provider]  folic acid (FOLVITE) 1 MG tablet TAKE 1 TABLET BY MOUTH ONCE DAILY 12/08/17  Yes [place], Brand Males, NP  glipiZIDE (GLUCOTROL XL) 10 MG 24 hr tablet Take 10 mg by mouth daily.    Yes [provider]  hydrochlorothiazide (HYDRODIURIL) 25 MG tablet Take 25 mg by mouth daily. 07/03/17  Yes [provider]  metFORMIN (GLUCOPHAGE-XR) 750 MG 24 hr tablet Take 1,500 mg by mouth at bedtime.    Yes [provider]  Multiple Vitamin (MULTIVITAMIN WITH MINERALS) TABS Take 1 tablet by mouth daily. Men's One a Day   Yes [provider]  simvastatin (ZOCOR) 20 MG tablet Take 20 mg by  mouth daily. 05/10/17  Yes [provider]  telmisartan (MICARDIS) 80 MG tablet Take 80 mg by mouth daily.    Yes [provider]  vitamin C (ASCORBIC ACID) 500 MG tablet Take 500 mg by mouth daily.   Yes [provider]  acetaminophen (TYLENOL) 500 MG tablet Take 1 tablet (500 mg total) by mouth every 6 (six) hours as needed for mild pain. Patient not taking: Reported on 07/30/2017 03/05/17   Everlene Farrier, PA-C  cyclobenzaprine (FLEXERIL) 10 MG tablet Take 0.5-1 tablets (5-10 mg total) by mouth at bedtime for 10 days. 04/13/18 04/23/18  Nira Conn, MD  naproxen (NAPROSYN) 500 MG tablet Take 1 tablet (500 mg total) by mouth 2 (two) times daily. Patient not taking: Reported on 03/05/2017 01/01/17   Audry Pili, PA-C  omeprazole (PRILOSEC) 20 MG capsule Take 1 capsule (20 mg total) by mouth daily. Patient not taking: Reported on 04/13/2018 07/30/17   Elson Areas, PA-C  Past Surgical History Past Surgical History:  Procedure Laterality Date  . APPENDECTOMY    . HEMORRHOID SURGERY     Family History Family History  Problem Relation Age of Onset  . Brain cancer Brother        deceased age 70  . Lung cancer Brother        deceased age 43  . Diabetes Mother   . Heart disease Mother        CHF, deceased 37  . Diabetes Father   . Kidney failure Father        deceased 43  . Colon cancer Neg Hx     Social History Social History   Tobacco Use  . Smoking status: Never Smoker  . Smokeless tobacco: Never Used  Substance Use Topics  . Alcohol use: No    Alcohol/week: 0.0 oz  . Drug use: No   Allergies Hydrocodone; Amoxicillin; Penicillins; and Percocet [oxycodone-acetaminophen]  Review of Systems Review of Systems  Respiratory: Negative for shortness of breath.   Cardiovascular: Negative for chest pain.    Gastrointestinal: Negative for abdominal pain.  Genitourinary: Positive for flank pain.  Neurological: Negative for headaches.   All other systems are reviewed and are negative for acute change except as noted in the HPI  Physical Exam Vital Signs  I have reviewed the triage vital signs BP (!) 156/105 (BP Location: Left Arm)   Pulse 77   Temp 98.2 F (36.8 C) (Oral)   Resp 18   Ht  (1.651 m)   Wt 79.4 kg (175 lb)   SpO2 99%   BMI 29.12 kg/m   Physical Exam  Constitutional: He is oriented to person, place, and time. He appears well-developed and well-nourished. No distress.  HENT:  Head: Normocephalic and atraumatic.  Right Ear: External ear normal.  Left Ear: External ear normal.  Nose: Nose normal.  Mouth/Throat: Mucous membranes are normal. No trismus in the jaw.  Eyes: Conjunctivae and EOM are normal. No scleral icterus.  Neck: Normal range of motion and phonation normal.  Cardiovascular: Normal rate and regular rhythm.  Pulmonary/Chest: Effort normal. No stridor. No respiratory distress.  Abdominal: He exhibits no distension. There is CVA tenderness (left). There is no rigidity and no rebound.  Musculoskeletal: Normal range of motion. He exhibits no edema.       Lumbar back: He exhibits tenderness.       Back:  Neurological: He is alert and oriented to person, place, and time.  Skin: He is not diaphoretic.  Psychiatric: He has a normal mood and affect. His behavior is normal.  Vitals reviewed.   ED Results and Treatments Labs (all labs ordered are listed, but only abnormal results are displayed) Labs Reviewed  URINALYSIS, ROUTINE W REFLEX MICROSCOPIC - Abnormal; Notable for the following components:      Result Value   Color, Urine STRAW (*)    Specific Gravity, Urine 1.004 (*)    All other components within normal limits  EKG  EKG  Interpretation  Date/Time:    Ventricular Rate:    PR Interval:    QRS Duration:   QT Interval:    QTC Calculation:   R Axis:     Text Interpretation:        Radiology No results found. Pertinent labs & imaging results that were available during my care of the patient were reviewed by me and considered in my medical decision making (see chart for details).  Medications Ordered in ED Medications - No data to display                                                                                                                                  Procedures Procedures EMERGENCY DEPARTMENT US RENAL EXAM  "Study: Limited Retroperitoneal Ultrasound of Kidneys"  INDICATIONS: Flank pain Long and short axis of both kidneys were obtained.   PERFORMED BY: Myself IMAGES ARCHIVED?: Yes LIMITATIONS: Body habitus VIEWS USED: Long axis and Short axis  INTERPRETATION: No Hydronephrosis, No Kidney stone    (including critical care time)  Medical Decision Making / ED Course I have reviewed the nursing notes for this encounter and the patient's prior records (if available in EHR or on provided paperwork).    Patient here with 4 days of left flank pain.  Exacerbated with movement and range of motion.  Tender to palpation to left flank over the musculature.  Bedside ultrasound without evidence of hydronephrosis.  UA without hematuria or evidence of infection.  Abdomen benign, doubt diverticulitis, small bowel obstruction or other serious intra-abdominal inflammatory/infectious processes.  No midline tenderness to palpation.  No symptoms concerning for cauda equina.  Likely muscular in nature.  The patient appears reasonably screened and/or stabilized for discharge and I doubt any other medical condition or other [facility] requiring further screening, evaluation, or treatment in the ED at this time prior to discharge.  The patient is safe for discharge with strict return precautions.   Final  Clinical Impression(s) / ED Diagnoses Final diagnoses:  Left flank pain      This chart was dictated using voice recognition software.  Despite best efforts to proofread,  errors can occur which can change the documentation meaning.   Nira Conn, MD 04/13/18 1742

## 2018-04-13 NOTE — ED Triage Notes (Signed)
Patient reports that he has been having intermittent left flank pain x 3 days. Patient denies any problems with urination.

## 2018-04-13 NOTE — Discharge Instructions (Signed)
You may use over-the-counter Motrin (Ibuprofen), Acetaminophen (Tylenol), topical muscle creams such as SalonPas, Icy Hot, Bengay, etc. Please stretch, apply heat, and have massage therapy for additional assistance. ° °

## 2018-06-20 ENCOUNTER — Other Ambulatory Visit: Payer: Self-pay

## 2018-06-20 ENCOUNTER — Emergency Department (HOSPITAL_COMMUNITY)
Admission: EM | Admit: 2018-06-20 | Discharge: 2018-06-20 | Disposition: A | Discharge status: Home / Self Care | Payer: BLUE CROSS/BLUE SHIELD | Attending: Emergency Medicine | Admitting: Emergency Medicine

## 2018-06-20 ENCOUNTER — Encounter (HOSPITAL_COMMUNITY): Payer: Self-pay | Admitting: Emergency Medicine

## 2018-06-20 DIAGNOSIS — E119 Type 2 diabetes mellitus without complications: Secondary | ICD-10-CM | POA: Diagnosis not present

## 2018-06-20 DIAGNOSIS — M62838 Other muscle spasm: Secondary | ICD-10-CM | POA: Diagnosis not present

## 2018-06-20 DIAGNOSIS — Z7984 Long term (current) use of oral hypoglycemic drugs: Secondary | ICD-10-CM | POA: Insufficient documentation

## 2018-06-20 DIAGNOSIS — Z7982 Long term (current) use of aspirin: Secondary | ICD-10-CM | POA: Insufficient documentation

## 2018-06-20 DIAGNOSIS — Z79899 Other long term (current) drug therapy: Secondary | ICD-10-CM | POA: Diagnosis not present

## 2018-06-20 LAB — HEPATIC FUNCTION PANEL
ALBUMIN: 4.1 g/dL (ref 3.5–5.0)
ALT: 21 U/L (ref 0–44)
AST: 26 U/L (ref 15–41)
Alkaline Phosphatase: 63 U/L (ref 38–126)
BILIRUBIN DIRECT: 0.3 mg/dL — AB (ref 0.0–0.2)
Indirect Bilirubin: 1.5 mg/dL — ABNORMAL HIGH (ref 0.3–0.9)
TOTAL PROTEIN: 7.8 g/dL (ref 6.5–8.1)
Total Bilirubin: 1.8 mg/dL — ABNORMAL HIGH (ref 0.3–1.2)

## 2018-06-20 LAB — T4, FREE: Free T4: 0.89 ng/dL (ref 0.82–1.77)

## 2018-06-20 LAB — I-STAT CHEM 8, ED
BUN: 16 mg/dL (ref 8–23)
CHLORIDE: 101 mmol/L (ref 98–111)
Calcium, Ion: 1.16 mmol/L (ref 1.15–1.40)
Creatinine, Ser: 1.2 mg/dL (ref 0.61–1.24)
Glucose, Bld: 105 mg/dL — ABNORMAL HIGH (ref 70–99)
HEMATOCRIT: 45 % (ref 39.0–52.0)
Hemoglobin: 15.3 g/dL (ref 13.0–17.0)
POTASSIUM: 4.1 mmol/L (ref 3.5–5.1)
Sodium: 139 mmol/L (ref 135–145)
TCO2: 25 mmol/L (ref 22–32)

## 2018-06-20 LAB — MAGNESIUM: Magnesium: 1.9 mg/dL (ref 1.7–2.4)

## 2018-06-20 LAB — TSH: TSH: 0.789 u[IU]/mL (ref 0.350–4.500)

## 2018-06-20 LAB — CK: CK TOTAL: 424 U/L — AB (ref 49–397)

## 2018-06-20 MED ORDER — DIAZEPAM 2 MG PO TABS: 2.0000 mg | ORAL_TABLET | Freq: Three times a day (TID) | ORAL | 0 refills | Status: AC | PRN

## 2018-06-20 MED ORDER — SODIUM CHLORIDE 0.9 % IV BOLUS
500.0000 mL | Freq: Once | INTRAVENOUS | Status: AC
  Administered 2018-06-20: 500 mL via INTRAVENOUS

## 2018-06-20 MED ORDER — DIAZEPAM 5 MG/ML IJ SOLN
5.0000 mg | Freq: Once | INTRAMUSCULAR | Status: AC
  Administered 2018-06-20: 2.58 mg via INTRAMUSCULAR
  Filled 2018-06-20: qty 2

## 2018-06-20 NOTE — ED Triage Notes (Addendum)
Pt reports bilateral muscle spasms in inner thighs. Hx of such- reports he was up walking trying help it. Walking sometimes helps. Denies taking lasix. 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 Reports he takes diabetes meds as should and had physical 2 weeks ago with clean report.

## 2018-06-20 NOTE — ED Provider Notes (Signed)
[facility]   CSN: [id number] Arrival date & time: 06/20/18  0736     History   Chief Complaint Chief Complaint  Patient presents with  . Spasms    HPI James [name] is a 67 y.o. male with a history of T2DM, GERD, HLD, HTN who presents with spasms.   Patient reports that last night (7 hours prior to presentation) he had an abrupt onset of muscle spasms of the inner thighs. The muscle spasms lasted for several seconds. He reports that he continued to have 2-3 episodes since onset (the last one lasting 5 minutes). He endorses a similar episode 1 year ago and reports that the muscle spasms self-resolved with walking.   He denies SOB, chest pain, leg swelling. He denies headache, weakness, numbness. Denies changes in bowel movements and urinary symptoms. Denies fevers, chills, night sweats.  He does take Atorvastatin 20 mg QD for HLD. He also takes Metformin and Glipizide for his T2DM. He denies any recent changes in his medication.   Past Medical History:  Diagnosis Date  . Diabetes mellitus   . GERD (gastroesophageal reflux disease)   . HLD (hyperlipidemia)   . Hydrocele   . Hypertension     Patient Active Problem List   Diagnosis Date Noted  . Hypertension 02/22/2011  . Diabetes mellitus type II 02/22/2011  . Anemia, unspecified 02/22/2011  . Enteritis 02/22/2011    Past Surgical History:  Procedure Laterality Date  . APPENDECTOMY    . HEMORRHOID SURGERY          Home Medications    Prior to Admission medications   Medication Sig Start Date End Date Taking? Authorizing Provider  acetaminophen (TYLENOL) 500 MG tablet Take 1 tablet (500 mg total) by mouth every 6 (six) hours as needed for mild pain. Patient not taking: Reported on 07/30/2017 03/05/17   Everlene [name], William, PA-C  aspirin EC 81 MG tablet Take 81 mg by mouth daily.     [provider]  ferrous sulfate 325 (65 FE) MG tablet Take 325 mg by  mouth daily with breakfast.    [provider]  folic acid (FOLVITE) 1 MG tablet TAKE 1 TABLET BY MOUTH ONCE DAILY 12/08/17   [place], Brand MalesSarah M, NP  glipiZIDE (GLUCOTROL XL) 10 MG 24 hr tablet Take 10 mg by mouth daily.     [provider]  hydrochlorothiazide (HYDRODIURIL) 25 MG tablet Take 25 mg by mouth daily. 07/03/17   [provider]  metFORMIN (GLUCOPHAGE-XR) 750 MG 24 hr tablet Take 1,500 mg by mouth at bedtime.     [provider]  Multiple Vitamin (MULTIVITAMIN WITH MINERALS) TABS Take 1 tablet by mouth daily. Men's One a Day    [provider]  naproxen (NAPROSYN) 500 MG tablet Take 1 tablet (500 mg total) by mouth 2 (two) times daily. Patient not taking: Reported on 03/05/2017 01/01/17   Audry [name], [place], PA-C  omeprazole (PRILOSEC) 20 MG capsule Take 1 capsule (20 mg total) by mouth daily. Patient not taking: Reported on 04/13/2018 07/30/17   Elson AreasSofia, Leslie K, PA-C  simvastatin (ZOCOR) 20 MG tablet Take 20 mg by mouth daily. 05/10/17   [provider]  telmisartan (MICARDIS) 80 MG tablet Take 80 mg by mouth daily.     [provider]  vitamin C (ASCORBIC ACID) 500 MG tablet Take 500 mg by mouth daily.    [provider]    Family History Family History  Problem  Relation Age of Onset  . Brain cancer Brother        deceased age 45  . Lung cancer Brother        deceased age 83  . Diabetes Mother   . Heart disease Mother        CHF, deceased 47  . Diabetes Father   . Kidney failure Father        deceased 33  . Colon cancer Neg Hx     Social History Social History   Tobacco Use  . Smoking status: Never Smoker  . Smokeless tobacco: Never Used  Substance Use Topics  . Alcohol use: No    Alcohol/week: 0.0 oz  . Drug use: No     Allergies   Hydrocodone; Amoxicillin; Penicillins; and Percocet [oxycodone-acetaminophen]   Review of Systems Review of Systems  Constitutional: Negative for activity change,  appetite change and fever.  HENT: Negative for sinus pain and sore throat.   Eyes: Negative for visual disturbance.  Respiratory: Negative for chest tightness and shortness of breath.   Cardiovascular: Negative for chest pain, palpitations and leg swelling.  Gastrointestinal: Negative for abdominal pain, diarrhea, nausea and vomiting.  Genitourinary: Negative for dysuria, frequency, hematuria and urgency.  Musculoskeletal:       Muscle spasms and pain of internal thighs bilaterally.  Skin: Negative for rash.  Neurological: Negative for dizziness, weakness, light-headedness, numbness and headaches.  All other systems reviewed and are negative.    Physical Exam Updated Vital Signs BP 133/88 (BP Location: Right Arm)   Pulse 91   Temp 97.8 F (36.6 C) (Oral)   Resp 17   SpO2 96%   Physical Exam  Constitutional: He appears well-developed.  HENT:  Head: Normocephalic and atraumatic.  Eyes: EOM are normal.  Neck: Normal range of motion.  Cardiovascular: Regular rhythm, normal heart sounds and intact distal pulses.  Tachycardic (100-110's)  Pulmonary/Chest: Effort normal and breath sounds normal.  Abdominal: Soft. Bowel sounds are normal.  Musculoskeletal: Normal range of motion.  Neurological: He is alert.  Skin: Skin is warm and dry.  Psychiatric: He has a normal mood and affect. His behavior is normal.  Nursing note and vitals reviewed.    ED Treatments / Results  Labs (all labs ordered are listed, but only abnormal results are displayed) Labs Reviewed  CK - Abnormal; Notable for the following components:      Result Value   Total CK 424 (*)    All other components within normal limits  HEPATIC FUNCTION PANEL - Abnormal; Notable for the following components:   Total Bilirubin 1.8 (*)    Bilirubin, Direct 0.3 (*)    Indirect Bilirubin 1.5 (*)    All other components within normal limits  I-STAT CHEM 8, ED - Abnormal; Notable for the following components:   Glucose,  Bld 105 (*)    All other components within normal limits  MAGNESIUM  TSH  T4, FREE  T3, FREE    EKG None  Radiology No results found.  Procedures Procedures (including critical care time)  Medications Ordered in ED Medications  diazepam (VALIUM) injection 5 mg (2.58 mg Intramuscular Given 06/20/18 0937)  sodium chloride 0.9 % bolus 500 mL (0 mLs Intravenous Stopped 06/20/18 1222)     Initial Impression / Assessment and Plan / ED Course  I have reviewed the triage vital signs and the nursing notes.  Pertinent labs & imaging results that were available during my care of the patient were reviewed by  me and considered in my medical decision making (see chart for details).  DILLYN MENNA is a 66 y.o. male with a history of T2DM, GERD, HLD, HTN who presents with muscle spasms. 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 LFT's unremarkable, magnesium WNL, Chem-8 unremarkable. TSH, free T3/T4 collected to evaluate for hypothyroidism with subsequent muscle spasms (pending). Patient had a severe muscle spasm in the ED and was subsequently given Valium 2.5 mg with resolution of spasm. Patient also given 1 L NS for elevated creatinine kinase. I recommended discontinuation of statin use and close follow-up with PCP. Patient will be notified in thyroid studies are abnormal. Valium 2 mg PO Q8h PRN muscle spasm given. Prior to discharge, patient was ambulating well and had appropriate PO intake.    Final Clinical Impressions(s) / ED Diagnoses   Final diagnoses:  Muscle spasm    ED Discharge Orders    None       Synetta Shadow, MD 06/20/18 1610    Blane Ohara, MD 06/21/18 [phone]

## 2018-06-20 NOTE — Discharge Instructions (Addendum)
Please stop taking your Atorvastatin and follow-up with your PCP within the next week. Please take one Valium every 8 hours as needed for muscle spasms.

## 2018-06-20 NOTE — ED Notes (Signed)
Pt sitting at bedside with c/o increased spasm and pain.10/10 wife at side.

## 2018-06-21 LAB — T3, FREE: T3 FREE: 3.2 pg/mL (ref 2.0–4.4)

## 2018-07-26 ENCOUNTER — Other Ambulatory Visit: Payer: Self-pay | Admitting: Family

## 2018-07-26 DIAGNOSIS — D649 Anemia, unspecified: Secondary | ICD-10-CM

## 2019-01-22 IMAGING — CT CT ABD-PELV W/ CM
2 of 5 series · 15 of 46 positions shown, 17 images · IV contrast (isovue)
Comparison: 03/05/2017 and previous

CLINICAL DATA: Patient complaining of abdominal pain. Patient
states the pain comes and goes since [REDACTED]. Patient states the pain
is mid lower abdominal area. Diabetic-metformin

EXAM:
CT ABDOMEN AND PELVIS WITH CONTRAST
TECHNIQUE: Multidetector CT imaging of the abdomen and pelvis was performed
using the standard protocol following bolus administration of
intravenous contrast.
CONTRAST:  100 ml isovue 300  Bun 16 cr 1.1 07-30-2017 GFR >60

[Series 2: abd/pel with · axial · 0.68mm/px · z∈[-426,-30]mm · 12 of 95 slices shown, 14 images]
[im 8/95  soft-tissue]
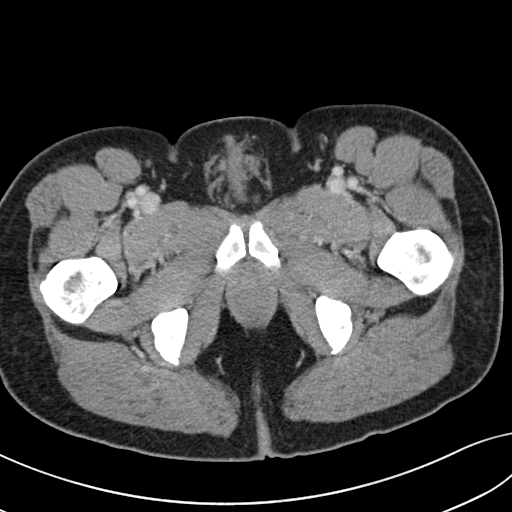
[im 8/95  bone]
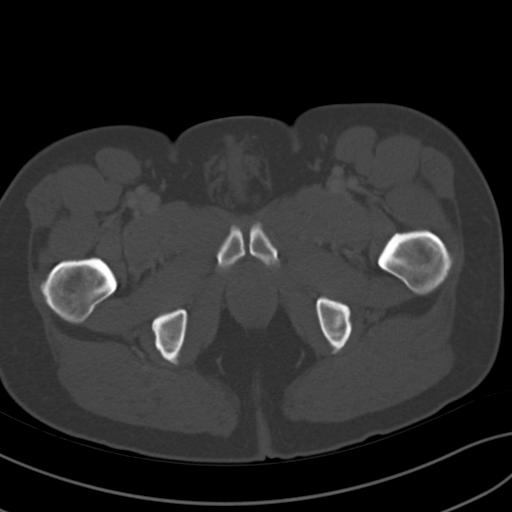
[im 15/95  soft-tissue]
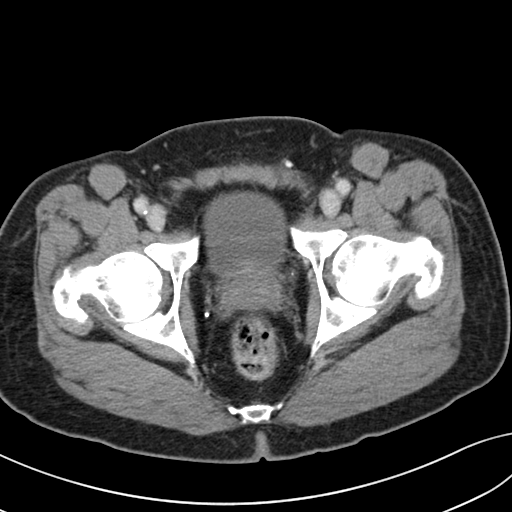
[im 22/95  soft-tissue]
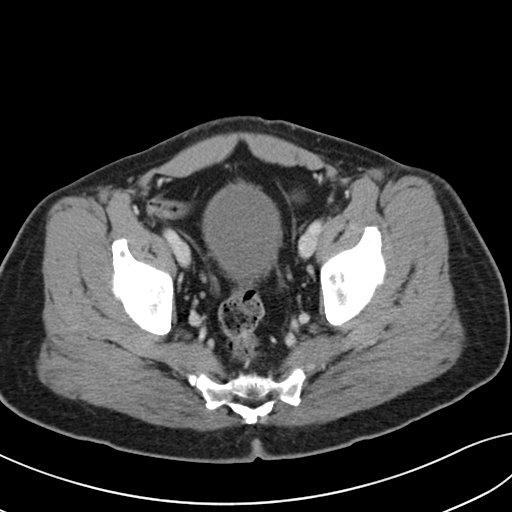
[im 29/95  soft-tissue]
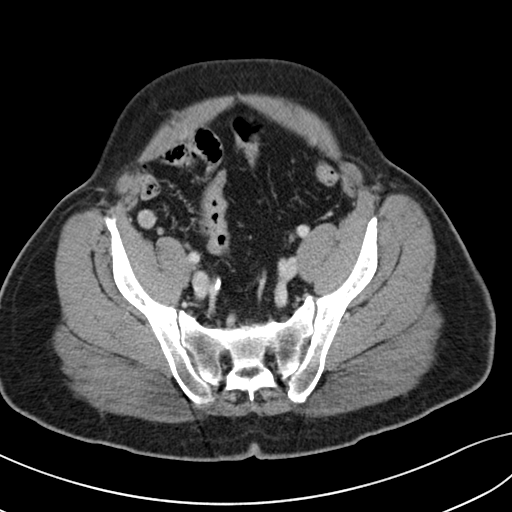
[im 37/95  soft-tissue]
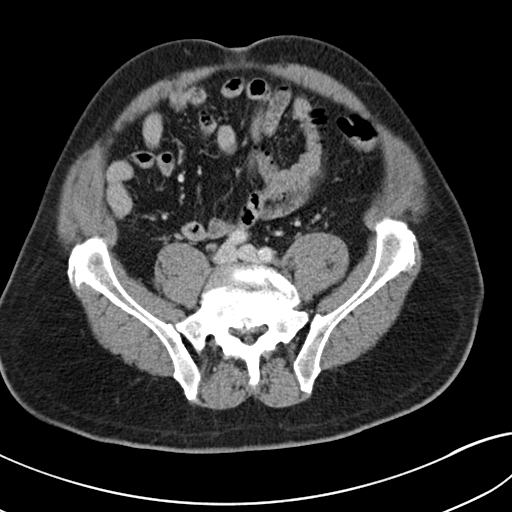
[im 44/95  soft-tissue]
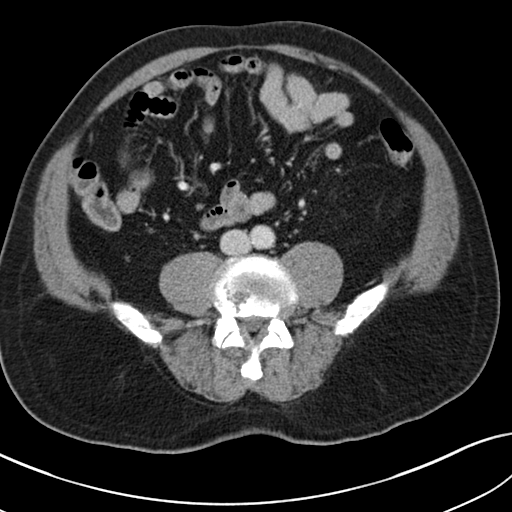
[im 51/95  soft-tissue]
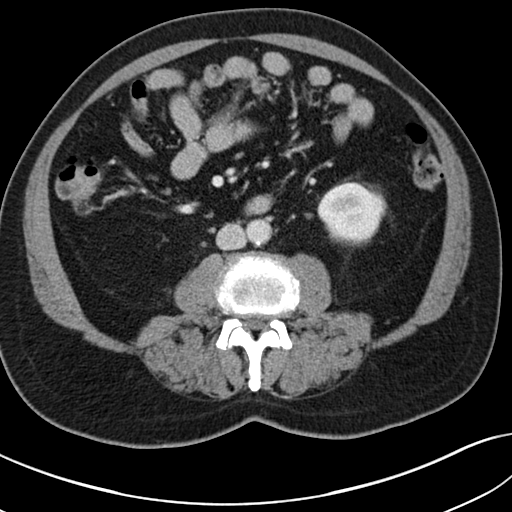
[im 58/95  soft-tissue]
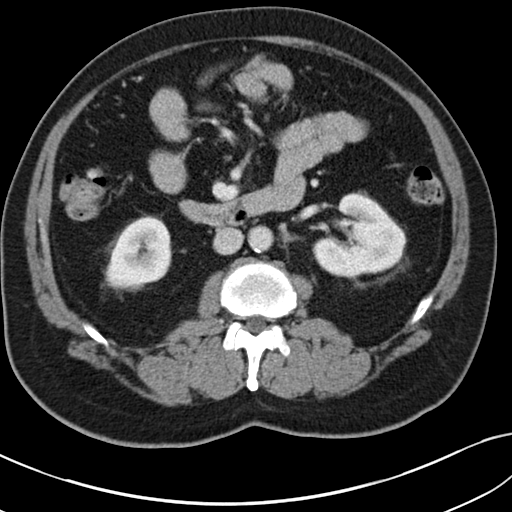
[im 66/95  soft-tissue]
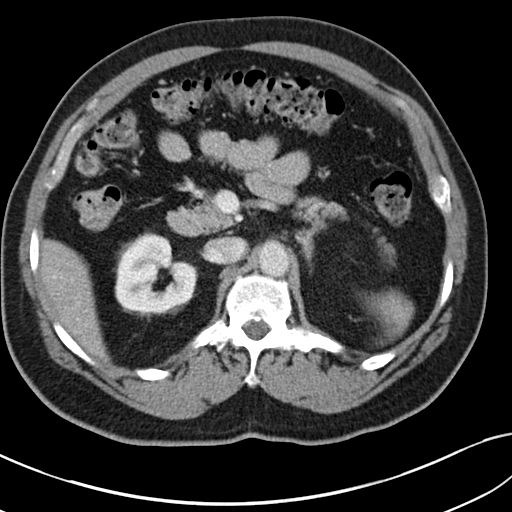
[im 66/95  bone]
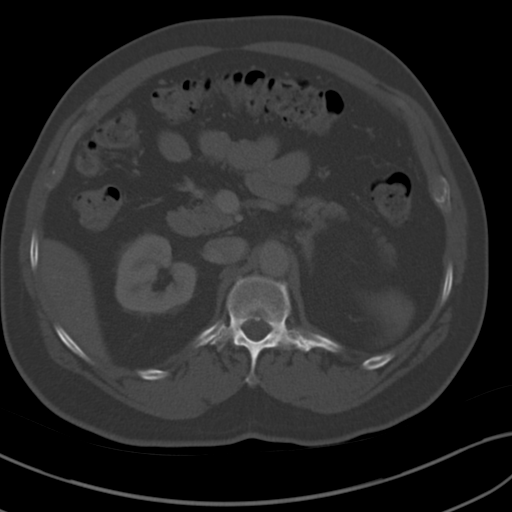
[im 73/95  soft-tissue]
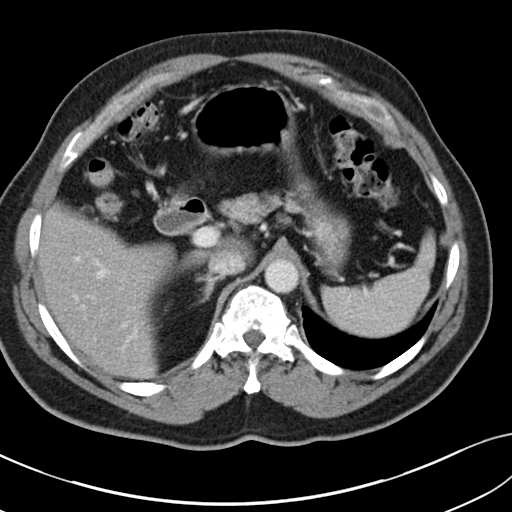
[im 80/95  soft-tissue]
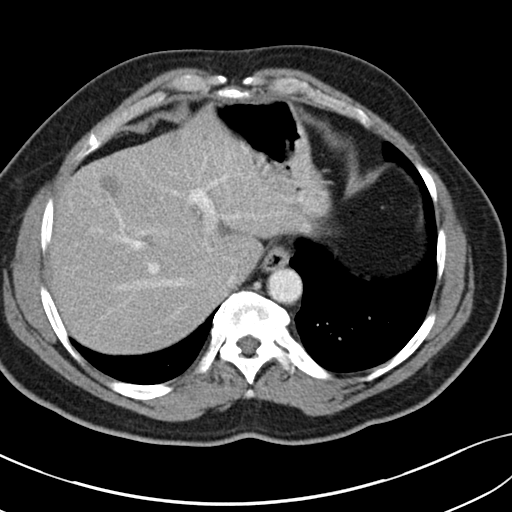
[im 87/95  soft-tissue]
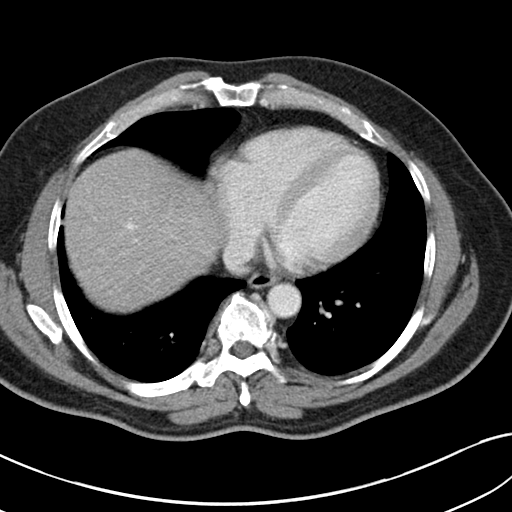

[Series 5: coronal a/|p · coronal · 0.79mm/px · 3 of 154 slices shown]
[im 52/154  soft-tissue]
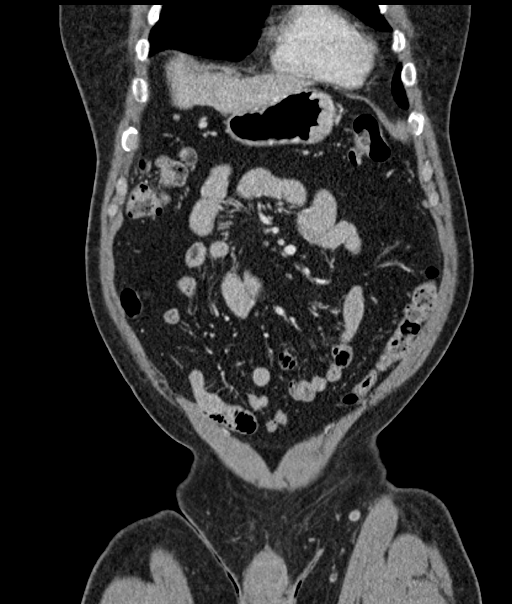
[im 69/154  soft-tissue]
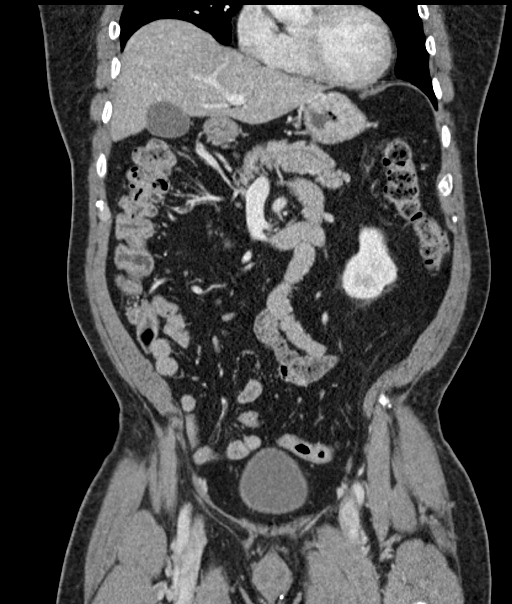
[im 86/154  soft-tissue]
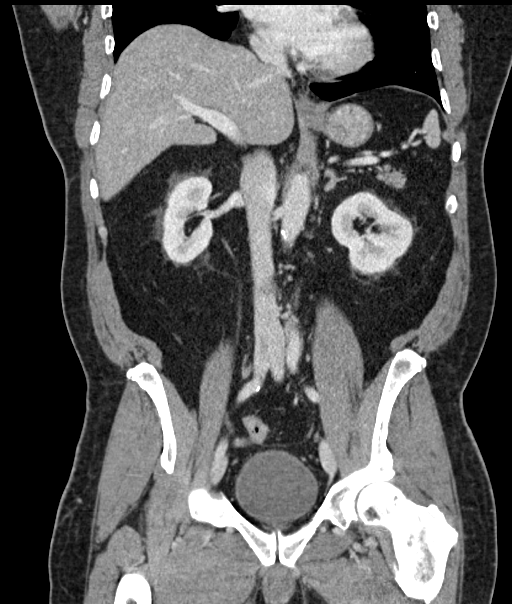

[15 of 46 positions shown; findings below may reference images not displayed]

FINDINGS: Lower chest: Coronary calcifications. No pleural or pericardial
effusion. Visualized lung bases clear.

Hepatobiliary: Stable benign cystic lesion in hepatic segment 8
since 04/28/2010. No new liver lesion. Gallbladder is nondistended,
unremarkable. No biliary dilatation.

Pancreas: Unremarkable. No pancreatic ductal dilatation or
surrounding inflammatory changes.

Spleen: Normal in size without focal abnormality.

Adrenals/Urinary Tract: Adrenal glands are unremarkable. Kidneys are
normal, without renal calculi, focal lesion, or hydronephrosis.
Bladder is unremarkable.

Stomach/Bowel: Stomach is within normal limits. Appendix appears
normal. No evidence of bowel wall thickening, distention, or
inflammatory changes.

Vascular/Lymphatic: Mild scattered aortoiliac calcified plaque
without aneurysm or stenosis. Portal vein patent. Bilateral pelvic
phleboliths. No abdominal or pelvic adenopathy.

Reproductive: Prostate is unremarkable.

Other: No ascites.  No free air.

Musculoskeletal: Stable Schmorl's nodes L4-S1. Negative for fracture
or worrisome bone lesion.
IMPRESSION: 1. No acute findings.
2. Coronary and aortoiliac  Atherosclerosis ([id number]-170.0)

## 2019-02-17 ENCOUNTER — Other Ambulatory Visit: Payer: Self-pay | Admitting: Family

## 2019-02-17 DIAGNOSIS — D649 Anemia, unspecified: Secondary | ICD-10-CM

## 2019-03-27 ENCOUNTER — Other Ambulatory Visit: Payer: Self-pay | Admitting: Family

## 2019-03-27 DIAGNOSIS — D649 Anemia, unspecified: Secondary | ICD-10-CM

## 2019-04-26 ENCOUNTER — Other Ambulatory Visit: Payer: Self-pay | Admitting: Family

## 2019-04-26 DIAGNOSIS — D649 Anemia, unspecified: Secondary | ICD-10-CM

## 2019-07-04 ENCOUNTER — Other Ambulatory Visit: Payer: Self-pay | Admitting: Family

## 2019-07-04 DIAGNOSIS — D649 Anemia, unspecified: Secondary | ICD-10-CM

## 2019-12-18 ENCOUNTER — Other Ambulatory Visit: Payer: Self-pay | Admitting: Family

## 2019-12-18 DIAGNOSIS — D649 Anemia, unspecified: Secondary | ICD-10-CM

## 2020-01-16 ENCOUNTER — Other Ambulatory Visit: Payer: Self-pay | Admitting: Family

## 2020-01-16 DIAGNOSIS — D649 Anemia, unspecified: Secondary | ICD-10-CM

## 2020-01-21 ENCOUNTER — Ambulatory Visit: Payer: Self-pay | Attending: Internal Medicine

## 2020-01-21 ENCOUNTER — Ambulatory Visit: Payer: Self-pay

## 2020-02-11 ENCOUNTER — Other Ambulatory Visit: Payer: Self-pay | Admitting: Family

## 2020-02-11 DIAGNOSIS — D649 Anemia, unspecified: Secondary | ICD-10-CM

## 2020-03-14 ENCOUNTER — Other Ambulatory Visit: Payer: Self-pay | Admitting: Family

## 2020-03-14 DIAGNOSIS — D649 Anemia, unspecified: Secondary | ICD-10-CM

## 2020-03-17 ENCOUNTER — Other Ambulatory Visit: Payer: Self-pay | Admitting: Family

## 2020-03-17 DIAGNOSIS — D649 Anemia, unspecified: Secondary | ICD-10-CM

## 2020-08-12 ENCOUNTER — Other Ambulatory Visit: Payer: Self-pay

## 2020-08-12 ENCOUNTER — Emergency Department (HOSPITAL_COMMUNITY)
Admission: EM | Admit: 2020-08-12 | Discharge: 2020-08-12 | Disposition: A | Discharge status: Home / Self Care | Payer: BC Managed Care – PPO | Attending: Emergency Medicine | Admitting: Emergency Medicine

## 2020-08-12 DIAGNOSIS — E119 Type 2 diabetes mellitus without complications: Secondary | ICD-10-CM | POA: Insufficient documentation

## 2020-08-12 DIAGNOSIS — Z20822 Contact with and (suspected) exposure to covid-19: Secondary | ICD-10-CM | POA: Insufficient documentation

## 2020-08-12 DIAGNOSIS — Z7984 Long term (current) use of oral hypoglycemic drugs: Secondary | ICD-10-CM | POA: Insufficient documentation

## 2020-08-12 DIAGNOSIS — Z7982 Long term (current) use of aspirin: Secondary | ICD-10-CM | POA: Diagnosis not present

## 2020-08-12 DIAGNOSIS — Z79899 Other long term (current) drug therapy: Secondary | ICD-10-CM | POA: Diagnosis not present

## 2020-08-12 DIAGNOSIS — I10 Essential (primary) hypertension: Secondary | ICD-10-CM | POA: Diagnosis not present

## 2020-08-12 DIAGNOSIS — J029 Acute pharyngitis, unspecified: Secondary | ICD-10-CM | POA: Diagnosis present

## 2020-08-12 LAB — SARS CORONAVIRUS 2 BY RT PCR (HOSPITAL ORDER, PERFORMED IN ~~LOC~~ HOSPITAL LAB): SARS Coronavirus 2: NEGATIVE

## 2020-08-12 LAB — GROUP A STREP BY PCR: Group A Strep by PCR: NOT DETECTED

## 2020-08-12 NOTE — ED Triage Notes (Signed)
Patient states he has a scratchy throat x 3 days. No otc medications helping. Patient says he wants antibiotics.

## 2020-08-12 NOTE — ED Notes (Signed)
Discharge paperwork reviewed with pt.  Pt with no questions or concerns at this time. Ambulatory at time of discharge.  

## 2020-08-12 NOTE — ED Provider Notes (Signed)
[facility]-EMERGENCY DEPT Provider Note   CSN: [id number] Arrival date & time: 08/12/20  1629     History Chief Complaint  Patient presents with  . Sore Throat    James Modi. is a 69 y.o. male.   Sore Throat This is a new problem. The current episode started more than 2 days ago. The problem occurs constantly. The problem has not changed since onset.Pertinent negatives include no chest pain, no abdominal pain, no headaches and no shortness of breath. Nothing aggravates the symptoms. Nothing relieves the symptoms. He has tried nothing for the symptoms. The treatment provided no relief.       Past Medical History:  Diagnosis Date  . Diabetes mellitus   . GERD (gastroesophageal reflux disease)   . HLD (hyperlipidemia)   . Hydrocele   . Hypertension     Patient Active Problem List   Diagnosis Date Noted  . Hypertension 02/22/2011  . Diabetes mellitus type II 02/22/2011  . Anemia, unspecified 02/22/2011  . Enteritis 02/22/2011    Past Surgical History:  Procedure Laterality Date  . APPENDECTOMY    . HEMORRHOID SURGERY         Family History  Problem Relation Age of Onset  . Brain cancer Brother        deceased age 55  . Lung cancer Brother        deceased age 61  . Diabetes Mother   . Heart disease Mother        CHF, deceased 42  . Diabetes Father   . Kidney failure Father        deceased 105  . Colon cancer Neg Hx     Social History   Tobacco Use  . Smoking status: Never Smoker  . Smokeless tobacco: Never Used  Substance Use Topics  . Alcohol use: No    Alcohol/week: 0.0 standard drinks  . Drug use: No    Home Medications Prior to Admission medications   Medication Sig Start Date End Date Taking? Authorizing Provider  acetaminophen (TYLENOL) 500 MG tablet Take 1 tablet (500 mg total) by mouth every 6 (six) hours as needed for mild pain. Patient not taking: Reported on 07/30/2017 03/05/17   Everlene Farrier, PA-C    aspirin EC 81 MG tablet Take 81 mg by mouth daily.     [provider]  ferrous sulfate 325 (65 FE) MG tablet Take 325 mg by mouth daily with breakfast.    [provider]  folic acid (FOLVITE) 1 MG tablet Take 1 tablet by mouth once daily 03/20/20   [place], Brand Males, NP  glipiZIDE (GLUCOTROL XL) 10 MG 24 hr tablet Take 10 mg by mouth daily.     [provider]  hydrochlorothiazide (HYDRODIURIL) 25 MG tablet Take 25 mg by mouth daily. 07/03/17   [provider]  metFORMIN (GLUCOPHAGE-XR) 750 MG 24 hr tablet Take 1,500 mg by mouth at bedtime.     [provider]  Multiple Vitamin (MULTIVITAMIN WITH MINERALS) TABS Take 1 tablet by mouth daily. Men's One a Day    [provider]  naproxen (NAPROSYN) 500 MG tablet Take 1 tablet (500 mg total) by mouth 2 (two) times daily. Patient not taking: Reported on 03/05/2017 01/01/17   Audry Pili, PA-C  omeprazole (PRILOSEC) 20 MG capsule Take 1 capsule (20 mg total) by mouth daily. Patient not taking: Reported on 04/13/2018 07/30/17   Elson Areas, PA-C  simvastatin (ZOCOR) 20 MG tablet Take  20 mg by mouth daily. 05/10/17   [provider]  telmisartan (MICARDIS) 80 MG tablet Take 80 mg by mouth daily.     [provider]  vitamin C (ASCORBIC ACID) 500 MG tablet Take 500 mg by mouth daily.    [provider]    Allergies    Hydrocodone, Amoxicillin, Penicillins, and Percocet [oxycodone-acetaminophen]  Review of Systems   Review of Systems  Constitutional: Negative for chills and fever.  HENT: Positive for sore throat. Negative for congestion, rhinorrhea, trouble swallowing and voice change.   Respiratory: Negative for cough and shortness of breath.   Cardiovascular: Negative for chest pain and palpitations.  Gastrointestinal: Negative for abdominal pain, diarrhea, nausea and vomiting.  Genitourinary: Negative for difficulty urinating and dysuria.  Musculoskeletal: Negative  for arthralgias and back pain.  Skin: Negative for color change and rash.  Neurological: Negative for light-headedness and headaches.    Physical Exam Updated Vital Signs BP (!) 122/94 (BP Location: Left Arm)   Pulse 87   Temp 98.3 F (36.8 C) (Oral)   Resp 18   Ht 5\' 5"  (1.651 m)   Wt (!) 170 kg   SpO2 96%   BMI 62.37 kg/m   Physical Exam Vitals and nursing note reviewed.  Constitutional:      General: He is not in acute distress.    Appearance: Normal appearance.  HENT:     Head: Normocephalic and atraumatic.     Nose: No rhinorrhea.     Mouth/Throat:     Mouth: Mucous membranes are moist. No oral lesions.     Pharynx: Posterior oropharyngeal erythema present. No pharyngeal swelling, oropharyngeal exudate or uvula swelling.     Tonsils: 1+ on the right. 1+ on the left.  Eyes:     General:        Right eye: No discharge.        Left eye: No discharge.     Conjunctiva/sclera: Conjunctivae normal.  Neck:     Thyroid: No thyromegaly.  Cardiovascular:     Rate and Rhythm: Normal rate and regular rhythm.     Heart sounds: No murmur heard.  No gallop.   Pulmonary:     Effort: Pulmonary effort is normal.     Breath sounds: No stridor.  Abdominal:     General: Abdomen is flat. There is no distension.     Palpations: Abdomen is soft.     Tenderness: There is no abdominal tenderness.  Musculoskeletal:        General: No deformity or signs of injury.  Lymphadenopathy:     Cervical: No cervical adenopathy.  Skin:    General: Skin is warm and dry.  Neurological:     General: No focal deficit present.     Mental Status: He is alert. Mental status is at baseline.     Motor: No weakness.  Psychiatric:        Mood and Affect: Mood normal.        Behavior: Behavior normal.        Thought Content: Thought content normal.     ED Results / Procedures / Treatments   Labs (all labs ordered are listed, but only abnormal results are displayed) Labs Reviewed  GROUP A STREP  BY PCR  SARS CORONAVIRUS 2 BY RT PCR (HOSPITAL ORDER, PERFORMED IN [facility])    EKG None  Radiology No results found.  Procedures Procedures (including critical care time)  Medications Ordered in ED Medications -  No data to display  ED Course  I have reviewed the triage vital signs and the nursing notes.  Pertinent labs & imaging results that were available during my care of the patient were reviewed by me and considered in my medical decision making (see chart for details).    MDM Rules/Calculators/A&P                          Pharyngitis with no significant signs of deep space infection, strep swab pending, Covid swab done as well no fevers chills no other significant symptoms.  Patient also has recent change to environmental factors coming running the air conditioning more than usual, this may be the cause.  Strep screen is negative.  Covid swab is pending.  He is safe for discharge home.  Strict return precautions given.  Antiinflammatories and Tylenol recommended for pain control and outpatient follow-up as needed Final Clinical Impression(s) / ED Diagnoses Final diagnoses:  Pharyngitis, unspecified etiology    Rx / DC Orders ED Discharge Orders    None       Sabino Donovan, MD 08/12/20 Flossie Buffy

## 2020-08-12 NOTE — Discharge Instructions (Addendum)
You can take 600 mg of ibuprofen every 6 hours, you can take 1000 mg of Tylenol every 6 hours, you can alternate these every 3 or you can take them together.  

## 2020-09-28 ENCOUNTER — Other Ambulatory Visit: Payer: Self-pay | Admitting: Family

## 2020-09-28 DIAGNOSIS — D649 Anemia, unspecified: Secondary | ICD-10-CM

## 2020-12-17 ENCOUNTER — Encounter (HOSPITAL_COMMUNITY): Payer: Self-pay | Admitting: Obstetrics and Gynecology

## 2020-12-17 ENCOUNTER — Other Ambulatory Visit: Payer: Self-pay

## 2020-12-17 ENCOUNTER — Emergency Department (HOSPITAL_COMMUNITY)
Admission: EM | Admit: 2020-12-17 | Discharge: 2020-12-17 | Disposition: A | Discharge status: Home / Self Care | Payer: BC Managed Care – PPO | Attending: Emergency Medicine | Admitting: Emergency Medicine

## 2020-12-17 DIAGNOSIS — Z7984 Long term (current) use of oral hypoglycemic drugs: Secondary | ICD-10-CM | POA: Insufficient documentation

## 2020-12-17 DIAGNOSIS — J069 Acute upper respiratory infection, unspecified: Secondary | ICD-10-CM | POA: Diagnosis not present

## 2020-12-17 DIAGNOSIS — U071 COVID-19: Secondary | ICD-10-CM | POA: Insufficient documentation

## 2020-12-17 DIAGNOSIS — Z7982 Long term (current) use of aspirin: Secondary | ICD-10-CM | POA: Insufficient documentation

## 2020-12-17 DIAGNOSIS — E119 Type 2 diabetes mellitus without complications: Secondary | ICD-10-CM | POA: Diagnosis not present

## 2020-12-17 DIAGNOSIS — Z79899 Other long term (current) drug therapy: Secondary | ICD-10-CM | POA: Insufficient documentation

## 2020-12-17 DIAGNOSIS — R059 Cough, unspecified: Secondary | ICD-10-CM | POA: Diagnosis present

## 2020-12-17 DIAGNOSIS — I10 Essential (primary) hypertension: Secondary | ICD-10-CM | POA: Insufficient documentation

## 2020-12-17 MED ORDER — BENZONATATE 100 MG PO CAPS: 100.0000 mg | ORAL_CAPSULE | Freq: Three times a day (TID) | ORAL | 0 refills | Status: DC

## 2020-12-17 NOTE — ED Provider Notes (Signed)
[facility]-EMERGENCY DEPT Provider Note   CSN: [id number] Arrival date & time: 12/17/20  1111     History Chief Complaint  Patient presents with  . Cough  . Sore Throat         James [name]. is a 70 y.o. male with PMH of type II DM, HTN, and HLD presents the ED with 1 day history of sore throat and nonproductive cough.  Patient reports that he works outside and notes that yesterday was particularly cold, approximately 4 F when he first left for work in the morning.  Upon returning home, he developed sore throat symptoms and a nonproductive cough.  He states that he is still eating and drinking without difficulty.  No facial swelling, no asymmetries, no drooling, and no difficulty swallowing.  Patient also denies any fevers, chills, shortness of breath, chest pain, hemoptysis, abdominal pain, body aches, or other symptoms.  He has been taking Mucinex, throat lozenges, and NyQuil, with some relief.  However, he states that he was having difficulty sleeping last night due to his frequent cough.  He states that he has taken [employer] in the past, with good relief.  He is fully immunized for COVID-19 and even received his booster injection in December.  No obvious sick contacts.  He denies any recent history of strep pharyngitis or exposures to young children.  HPI     Past Medical History:  Diagnosis Date  . Diabetes mellitus   . GERD (gastroesophageal reflux disease)   . HLD (hyperlipidemia)   . Hydrocele   . Hypertension     Patient Active Problem List   Diagnosis Date Noted  . Hypertension 02/22/2011  . Diabetes mellitus type II 02/22/2011  . Anemia, unspecified 02/22/2011  . Enteritis 02/22/2011    Past Surgical History:  Procedure Laterality Date  . APPENDECTOMY    . HEMORRHOID SURGERY         Family History  Problem Relation Age of Onset  . Brain cancer Brother        deceased age 19  . Lung cancer Brother        deceased age  2  . Diabetes Mother   . Heart disease Mother        CHF, deceased 65  . Diabetes Father   . Kidney failure Father        deceased 31  . Colon cancer Neg Hx     Social History   Tobacco Use  . Smoking status: Never Smoker  . Smokeless tobacco: Never Used  Substance Use Topics  . Alcohol use: No    Alcohol/week: 0.0 standard drinks  . Drug use: No    Home Medications Prior to Admission medications   Medication Sig Start Date End Date Taking? Authorizing Provider  benzonatate (TESSALON) 100 MG capsule Take 1 capsule (100 mg total) by mouth every 8 (eight) hours. 12/17/20  Yes James New, PA-C  acetaminophen (TYLENOL) 500 MG tablet Take 1 tablet (500 mg total) by mouth every 6 (six) hours as needed for mild pain. Patient not taking: Reported on 07/30/2017 03/05/17   Everlene Farrier, PA-C  aspirin EC 81 MG tablet Take 81 mg by mouth daily.     [provider]  ferrous sulfate 325 (65 FE) MG tablet Take 325 mg by mouth daily with breakfast.    [provider]  folic acid (FOLVITE) 1 MG tablet Take 1 tablet by mouth once daily 09/29/20   Arlan Organ  R, MD  glipiZIDE (GLUCOTROL XL) 10 MG 24 hr tablet Take 10 mg by mouth daily.     [provider]  hydrochlorothiazide (HYDRODIURIL) 25 MG tablet Take 25 mg by mouth daily. 07/03/17   [provider]  metFORMIN (GLUCOPHAGE-XR) 750 MG 24 hr tablet Take 1,500 mg by mouth at bedtime.     [provider]  Multiple Vitamin (MULTIVITAMIN WITH MINERALS) TABS Take 1 tablet by mouth daily. Men's One a Day    [provider]  naproxen (NAPROSYN) 500 MG tablet Take 1 tablet (500 mg total) by mouth 2 (two) times daily. Patient not taking: Reported on 03/05/2017 01/01/17   Audry Pili, PA-C  omeprazole (PRILOSEC) 20 MG capsule Take 1 capsule (20 mg total) by mouth daily. Patient not taking: Reported on 04/13/2018 07/30/17   Elson Areas, PA-C  simvastatin (ZOCOR) 20 MG tablet Take 20 mg by mouth  daily. 05/10/17   [provider]  telmisartan (MICARDIS) 80 MG tablet Take 80 mg by mouth daily.     [provider]  vitamin C (ASCORBIC ACID) 500 MG tablet Take 500 mg by mouth daily.    [provider]    Allergies    Hydrocodone, Amoxicillin, Penicillins, and Percocet [oxycodone-acetaminophen]  Review of Systems   Review of Systems  All other systems reviewed and are negative.   Physical Exam Updated Vital Signs BP (!) 137/91   Pulse 97   Temp 99.1 F (37.3 C) (Oral)   Resp 18   SpO2 95%   Physical Exam Vitals and nursing note reviewed. Exam conducted with a chaperone present.  Constitutional:      General: He is not in acute distress.    Appearance: Normal appearance. He is not toxic-appearing.  HENT:     Head: Normocephalic and atraumatic.     Mouth/Throat:     Pharynx: Oropharynx is clear. No oropharyngeal exudate.     Comments: Patent oropharynx.  No trismus.  No difficulty tolerating secretions.  No floor of mouth swelling.  No asymmetries noted. Eyes:     General: No scleral icterus.    Conjunctiva/sclera: Conjunctivae normal.  Cardiovascular:     Rate and Rhythm: Normal rate and regular rhythm.     Pulses: Normal pulses.     Heart sounds: Normal heart sounds.  Pulmonary:     Effort: Pulmonary effort is normal. No respiratory distress.     Breath sounds: Normal breath sounds. No wheezing or rales.     Comments: CTA bilaterally.  No increased work of breathing. Musculoskeletal:     Cervical back: Normal range of motion. No rigidity.     Right lower leg: No edema.     Left lower leg: No edema.  Skin:    General: Skin is dry.  Neurological:     General: No focal deficit present.     Mental Status: He is alert and oriented to person, place, and time.     GCS: GCS eye subscore is 4. GCS verbal subscore is 5. GCS motor subscore is 6.  Psychiatric:        Mood and Affect: Mood normal.        Behavior: Behavior normal.         Thought Content: Thought content normal.     ED Results / Procedures / Treatments   Labs (all labs ordered are listed, but only abnormal results are displayed) Labs Reviewed - No data to display  EKG None  Radiology No results  found.  Procedures Procedures (including critical care time)  Medications Ordered in ED Medications - No data to display  ED Course  I have reviewed the triage vital signs and the nursing notes.  Pertinent labs & imaging results that were available during my care of the patient were reviewed by me and considered in my medical decision making (see chart for details).    MDM Rules/Calculators/A&P                          Patient with symptoms consistent with viral illness for 1 day.  COVID-19 test is obtained and pending.  Discussed influenza testing, but we have decided not to proceed with testing.  His oropharyngeal exam is benign.  Patient able to speak in complete sentences without difficulty swallowing or trouble handling secretions.  No stridor, wheezing, tripoding, grey pseudomembrane, trismus, uvular deviation, sublingual/submandibular/submental swelling or induration, nuchal rigidity, or neck pain.  Low suspicion for deep tissue infection such as PTA or RPA, epiglottitis, vertebral dissection, meningitis, or other emergent pathology.    Given brevity of illness and reassuring physical exam, do not feel as though imaging of chest is warranted. Their symptoms are likely of viral etiology and we discussed that antibiotics are not indicated for viral infections.  Patient will be discharged with symptomatic treatment.  Patient is tolerating food and liquid without difficulty and I do not believe that laboratory work-up would yield any significant findings.  Low suspicion for electrolyte derangement, but emphasized the importance of eating regularly.  I also emphasized the importance of rest, continued oral hydration, and antipyretics as needed for fever  control.  Discussed conservative therapy such as warm tea with honey, Chloraseptic spray, throat lozenges, and salt-water gargles.    Patient is not demonstrating any increased work of breathing or exertional hypoxia.  However, given that they are high risk for poor outcomes, will place ambulatory orders for outpatient infusion, if deemed to be qualified candidate per infusion center.  They understand that they will not proceed with treatment if their COVID-19 test is negative.   They were provided opportunity to ask any additional questions and have none at this time.  Prior to discharge patient is feeling well, agreeable with plan for discharge home.  They have expressed understanding of verbal discharge instructions as well as return precautions and are agreeable to the plan.    Final Clinical Impression(s) / ED Diagnoses Final diagnoses:  Viral URI with cough    Rx / DC Orders ED Discharge Orders         Ordered    benzonatate (TESSALON) 100 MG capsule  Every 8 hours        12/17/20 1547           James New, PA-C 12/17/20 1608    James Nick, MD 12/17/20 2240

## 2020-12-17 NOTE — ED Triage Notes (Signed)
Patient reports cough and sore/itching throat since yesterday. Patient reports he is vaccinated agianst COVID 19

## 2020-12-17 NOTE — Discharge Instructions (Signed)
Your history and physical exam is suggestive of a viral illness.  You have been tested for COVID-19.    Please maintain isolation precautions.  Check your temperature regularly and take Tylenol as needed for fever control.  Increase your oral hydration and continue to eat regular meals to avoid electrolyte derangement and further fatigue.  I recommend over-the-counter medications as needed for symptom relief.  You may wish to consider obtaining a pulse oximeter over-the-counter at a pharmacy.  If you are below 90% oxygen saturation, you will need to return to the ED.   Given that you are high risk, I have placed an ambulatory referral to the outpatient infusion center. They will call you to determine if you are a viable candidate for treatment.   Follow-up with your primary care provider regarding today's encounter and for ongoing management.  If you do not have one, please get established with one as soon as possible.  If you do not have insurance, please consider the [facility].  Return to the ED or seek immediate medical attention should you experience any new or worsening symptoms.    If you have additional questions, contact your local health department or call the epidemiologist on call at [phone] (available 24/7). ? This guidance is subject to change. For the most up-to-date guidance from CDC, please refer to their website: https://www.cdc.gov/coronavirus/2019-ncov/hcp/guidance-prevent-spread.html   

## 2020-12-18 ENCOUNTER — Telehealth (HOSPITAL_COMMUNITY): Payer: Self-pay | Admitting: Family

## 2020-12-18 ENCOUNTER — Other Ambulatory Visit (HOSPITAL_COMMUNITY): Payer: Self-pay | Admitting: Family

## 2020-12-18 DIAGNOSIS — U071 COVID-19: Secondary | ICD-10-CM

## 2020-12-18 LAB — SARS CORONAVIRUS 2 (TAT 6-24 HRS): SARS Coronavirus 2: POSITIVE — AB

## 2020-12-18 MED ORDER — MOLNUPIRAVIR EUA 200MG CAPSULE: 4.0000 | ORAL_CAPSULE | Freq: Two times a day (BID) | ORAL | 0 refills | Status: AC

## 2020-12-18 NOTE — Telephone Encounter (Signed)
Called to discuss with patient about COVID-19 symptoms and the use of one of the available treatments for those with mild to moderate Covid symptoms and at a high risk of hospitalization.  Pt appears to qualify for outpatient treatment due to co-morbid conditions and/or a member of an at-risk group in accordance with the FDA Emergency Use Authorization.    Unable to reach pt - VM left  James [name]   

## 2020-12-18 NOTE — Telephone Encounter (Signed)
Outpatient Oral COVID Treatment Note  I connected with James Stanford Sr. on 12/18/2020/7:28 PM by telephone and verified that I am speaking with the correct person using two identifiers.  I discussed the limitations, risks, security, and privacy concerns of performing an evaluation and management service by telephone and the availability of in person appointments. I also discussed with the patient that there may be a patient responsible charge related to this service. The patient expressed understanding and agreed to proceed.  Patient location: Home Provider location: Work  Diagnosis: COVID-19 infection  Purpose of visit: Discussion of potential use of Molnupiravir or Paxlovid, a new treatment for mild to moderate COVID-19 viral infection in non-hospitalized patients.   Subjective: Patient is a 70 y.o. male who has been diagnosed with COVID 19 viral infection.  Their symptoms began on 12/16/2020  with cough.  He is fully vaccinated.   Past Medical History:  Diagnosis Date  . Diabetes mellitus   . GERD (gastroesophageal reflux disease)   . HLD (hyperlipidemia)   . Hydrocele   . Hypertension     Allergies  Allergen Reactions  . Hydrocodone Swelling  . Amoxicillin Rash  . Penicillins Rash and Other (See Comments)    Has patient had a PCN reaction causing immediate rash, facial/tongue/throat swelling, SOB or lightheadedness with hypotension: yes Has patient had a PCN reaction causing severe rash involving mucus membranes or skin necrosis: No Has patient had a PCN reaction that required hospitalization yes Has patient had a PCN reaction occurring within the last 10 years: No If all of the above answers are "NO", then may proceed with Cephalosporin use. Caused him to pass out    . Percocet [Oxycodone-Acetaminophen] Itching and Rash     Current Outpatient Medications:  .  molnupiravir EUA 200 mg CAPS, Take 4 capsules (800 mg total) by mouth 2 (two) times daily for 5 days., Disp: 40  capsule, Rfl: 0 .  acetaminophen (TYLENOL) 500 MG tablet, Take 1 tablet (500 mg total) by mouth every 6 (six) hours as needed for mild pain. (Patient not taking: Reported on 07/30/2017), Disp: 30 tablet, Rfl: 0 .  aspirin EC 81 MG tablet, Take 81 mg by mouth daily. , Disp: , Rfl:  .  benzonatate (TESSALON) 100 MG capsule, Take 1 capsule (100 mg total) by mouth every 8 (eight) hours., Disp: 21 capsule, Rfl: 0 .  ferrous sulfate 325 (65 FE) MG tablet, Take 325 mg by mouth daily with breakfast., Disp: , Rfl:  .  folic acid (FOLVITE) 1 MG tablet, Take 1 tablet by mouth once daily, Disp: 30 tablet, Rfl: 0 .  glipiZIDE (GLUCOTROL XL) 10 MG 24 hr tablet, Take 10 mg by mouth daily. , Disp: , Rfl:  .  hydrochlorothiazide (HYDRODIURIL) 25 MG tablet, Take 25 mg by mouth daily., Disp: , Rfl: 1 .  metFORMIN (GLUCOPHAGE-XR) 750 MG 24 hr tablet, Take 1,500 mg by mouth at bedtime. , Disp: , Rfl:  .  Multiple Vitamin (MULTIVITAMIN WITH MINERALS) TABS, Take 1 tablet by mouth daily. Men's One a Day, Disp: , Rfl:  .  naproxen (NAPROSYN) 500 MG tablet, Take 1 tablet (500 mg total) by mouth 2 (two) times daily. (Patient not taking: Reported on 03/05/2017), Disp: 30 tablet, Rfl: 0 .  omeprazole (PRILOSEC) 20 MG capsule, Take 1 capsule (20 mg total) by mouth daily. (Patient not taking: Reported on 04/13/2018), Disp: 30 capsule, Rfl: 0 .  simvastatin (ZOCOR) 20 MG tablet, Take 20 mg by mouth daily., Disp: ,  Rfl: 1 .  telmisartan (MICARDIS) 80 MG tablet, Take 80 mg by mouth daily. , Disp: , Rfl:  .  vitamin C (ASCORBIC ACID) 500 MG tablet, Take 500 mg by mouth daily., Disp: , Rfl:   Objective: Patient sounds to be in no apparent distress.  Breathing is non labored.  Mood and behavior are normal.  Laboratory Data:  Recent Results (from the past 2160 hour(s))  SARS CORONAVIRUS 2 (TAT 6-24 HRS) Nasopharyngeal Nasopharyngeal Swab     Status: Abnormal   Collection Time: 12/17/20  4:26 PM   Specimen: Nasopharyngeal Swab  Result  Value Ref Range   SARS Coronavirus 2 POSITIVE (A) NEGATIVE    Comment: (NOTE) SARS-CoV-2 target nucleic acids are DETECTED.  The SARS-CoV-2 RNA is generally detectable in upper and lower respiratory specimens during the acute phase of infection. Positive results are indicative of the presence of SARS-CoV-2 RNA. Clinical correlation with patient history and other diagnostic information is  necessary to determine patient infection status. Positive results do not rule out bacterial infection or co-infection with other viruses.  The expected result is Negative.  Fact Sheet for Patients: HairSlick.no  Fact Sheet for Healthcare Providers: quierodirigir.com  This test is not yet approved or cleared by the [place] FDA and  has been authorized for detection and/or diagnosis of SARS-CoV-2 by FDA under an Emergency Use Authorization (EUA). This EUA will remain  in effect (meaning this test can be used) for the duration of the COVID-19 declaration under Section 564(b)(1) of the Act, 21 U. S.C. section 360bbb-3(b)(1), unless the authorization is terminated or revoked sooner.   Performed at [facility], [address] [place], [place] [address]      Assessment: 70 y.o. male with mild/moderate COVID 19 viral infection diagnosed on 12/17/2020 at high risk for progression to severe COVID 19.  Plan:  This patient is a 70 y.o. male that meets the following criteria for Emergency Use Authorization of: Molnupiravir  1. Age >18 yr 2. SARS-COV-2 positive test 3. Symptom onset < 5 days 4. Mild-to-moderate COVID disease with high risk for severe progression to hospitalization or death   I have spoken and communicated the following to the patient or parent/caregiver regarding: 1. Molnupiravir is an unapproved drug that is authorized for use under an TEFL teacher.  2. There are no adequate, approved, available products  for the treatment of COVID-19 in adults who have mild-to-moderate COVID-19 and are at high risk for progressing to severe COVID-19, including hospitalization or death. 3. Other therapeutics are currently authorized. For additional information on all products authorized for treatment or prevention of COVID-19, please see https://www.graham-miller.com/.  4. There are benefits and risks of taking this treatment as outlined in the "Fact Sheet for Patients and Caregivers."  5. "Fact Sheet for Patients and Caregivers" was reviewed with patient. A hard copy will be provided to patient from pharmacy prior to the patient receiving treatment. 6. Patients should continue to self-isolate and use infection control measures (e.g., wear mask, isolate, social distance, avoid sharing personal items, clean and disinfect "high touch" surfaces, and frequent handwashing) according to CDC guidelines.  7. The patient or parent/caregiver has the option to accept or refuse treatment. 8. [employer] has established a pregnancy surveillance program. 9. Females of childbearing potential should use a reliable method of contraception correctly and consistently, as applicable, for the duration of treatment and for 4 days after the last dose of Molnupiravir. 10. Males of reproductive potential  who are sexually active with females of childbearing potential should use a reliable method of contraception correctly and consistently during treatment and for at least 3 months after the last dose. 11. Pregnancy status and risk was assessed. Patient verbalized understanding of precautions.   After reviewing above information with the patient, the patient agrees to receive molnupiravir.  Follow up instructions:    . Take prescription BID x 5 days as directed . Reach out to pharmacist for counseling on medication if desired . For concerns  regarding further COVID symptoms please follow up with your PCP or urgent care . For urgent or life-threatening issues, seek care at your local emergency department  The patient was provided an opportunity to ask questions, and all were answered. The patient agreed with the plan and demonstrated an understanding of the instructions.   Script sent to [facility] and opted to pick up RX.  The patient was advised to call their PCP or seek an in-person evaluation if the symptoms worsen or if the condition fails to improve as anticipated.   I provided 15 minutes of non face-to-face telephone visit time during this encounter, and > 50% was spent counseling as documented under my assessment & plan.  Morton Stall, NP 12/18/2020 Phineas Douglas PM

## 2020-12-19 ENCOUNTER — Telehealth (HOSPITAL_COMMUNITY): Payer: Self-pay | Admitting: Pharmacist

## 2020-12-19 MED FILL — MOLNUPIRAVIR 200 MG CAPS: 200 | 5 days supply | Qty: 40 | Fill #0

## 2020-12-19 NOTE — Telephone Encounter (Signed)
Patient was prescribed oral covid treatment molnupiravir and treatment note was reviewed. Medication has been received by [id number]] and reviewed for appropriateness.  Drug Interactions or Dosage Adjustments Noted: NO Delivery Method: PICK UP   Patient contacted for counseling on phone and verbalized understanding.   Delivery or Pick-Up Date: 12/19/20   James [name] 12/19/2020, 10:43 AM [facility] Pharmacist Phone# [phone]

## 2020-12-23 NOTE — Telephone Encounter (Signed)
error 

## 2021-03-03 ENCOUNTER — Other Ambulatory Visit: Payer: Self-pay

## 2021-03-03 ENCOUNTER — Encounter (HOSPITAL_COMMUNITY): Payer: Self-pay

## 2021-03-03 ENCOUNTER — Emergency Department (HOSPITAL_COMMUNITY)
Admission: EM | Admit: 2021-03-03 | Discharge: 2021-03-04 | Disposition: A | Discharge status: Home / Self Care | Payer: BC Managed Care – PPO | Attending: Emergency Medicine | Admitting: Emergency Medicine

## 2021-03-03 DIAGNOSIS — Z7982 Long term (current) use of aspirin: Secondary | ICD-10-CM | POA: Insufficient documentation

## 2021-03-03 DIAGNOSIS — R110 Nausea: Secondary | ICD-10-CM | POA: Diagnosis not present

## 2021-03-03 DIAGNOSIS — Z7984 Long term (current) use of oral hypoglycemic drugs: Secondary | ICD-10-CM | POA: Insufficient documentation

## 2021-03-03 DIAGNOSIS — Z79899 Other long term (current) drug therapy: Secondary | ICD-10-CM | POA: Insufficient documentation

## 2021-03-03 DIAGNOSIS — R197 Diarrhea, unspecified: Secondary | ICD-10-CM | POA: Diagnosis not present

## 2021-03-03 DIAGNOSIS — I10 Essential (primary) hypertension: Secondary | ICD-10-CM | POA: Diagnosis not present

## 2021-03-03 DIAGNOSIS — E119 Type 2 diabetes mellitus without complications: Secondary | ICD-10-CM | POA: Diagnosis not present

## 2021-03-03 LAB — URINALYSIS, ROUTINE W REFLEX MICROSCOPIC
Bilirubin Urine: NEGATIVE
Glucose, UA: NEGATIVE mg/dL
Hgb urine dipstick: NEGATIVE
Ketones, ur: NEGATIVE mg/dL
Leukocytes,Ua: NEGATIVE
Nitrite: NEGATIVE
Protein, ur: NEGATIVE mg/dL
Specific Gravity, Urine: 1.016 (ref 1.005–1.030)
pH: 6 (ref 5.0–8.0)

## 2021-03-03 LAB — COMPREHENSIVE METABOLIC PANEL
ALT: 20 U/L (ref 0–44)
AST: 28 U/L (ref 15–41)
Albumin: 4 g/dL (ref 3.5–5.0)
Alkaline Phosphatase: 66 U/L (ref 38–126)
Anion gap: 8 (ref 5–15)
BUN: 14 mg/dL (ref 8–23)
CO2: 27 mmol/L (ref 22–32)
Calcium: 8.9 mg/dL (ref 8.9–10.3)
Chloride: 101 mmol/L (ref 98–111)
Creatinine, Ser: 1.15 mg/dL (ref 0.61–1.24)
GFR, Estimated: >60 mL/min (ref >60)
Glucose, Bld: 126 mg/dL — ABNORMAL HIGH (ref 70–99)
Potassium: 4.5 mmol/L (ref 3.5–5.1)
Sodium: 136 mmol/L (ref 135–145)
Total Bilirubin: 0.9 mg/dL (ref 0.3–1.2)
Total Protein: 7 g/dL (ref 6.5–8.1)

## 2021-03-03 LAB — CBC
HCT: 42 % (ref 39.0–52.0)
Hemoglobin: 12.8 g/dL — ABNORMAL LOW (ref 13.0–17.0)
MCH: 22.8 pg — ABNORMAL LOW (ref 26.0–34.0)
MCHC: 30.5 g/dL (ref 30.0–36.0)
MCV: 74.7 fL — ABNORMAL LOW (ref 80.0–100.0)
Platelets: 296 10*3/uL (ref 150–400)
RBC: 5.62 MIL/uL (ref 4.22–5.81)
RDW: 14.6 % (ref 11.5–15.5)
WBC: 5.2 10*3/uL (ref 4.0–10.5)
nRBC: 0 % (ref 0.0–0.2)

## 2021-03-03 LAB — LIPASE, BLOOD: Lipase: 29 U/L (ref 11–51)

## 2021-03-03 MED ORDER — CITRUCEL PO POWD: 1.0000 | Freq: Every day | ORAL | 0 refills | Status: DC

## 2021-03-03 MED ORDER — LOPERAMIDE HCL 2 MG PO CAPS: 2.0000 mg | ORAL_CAPSULE | Freq: Four times a day (QID) | ORAL | 0 refills | Status: DC | PRN

## 2021-03-03 MED ORDER — SODIUM CHLORIDE 0.9 % IV SOLN
1000.0000 mL | INTRAVENOUS | Status: DC
  Administered 2021-03-03: 1000 mL via INTRAVENOUS

## 2021-03-03 MED ORDER — ONDANSETRON HCL 4 MG/2ML IJ SOLN
4.0000 mg | Freq: Once | INTRAMUSCULAR | Status: AC
  Administered 2021-03-03: 4 mg via INTRAVENOUS
  Filled 2021-03-03: qty 2

## 2021-03-03 MED ORDER — LOPERAMIDE HCL 2 MG PO CAPS
4.0000 mg | ORAL_CAPSULE | Freq: Once | ORAL | Status: AC
  Administered 2021-03-03: 4 mg via ORAL
  Filled 2021-03-03: qty 2

## 2021-03-03 MED ORDER — ONDANSETRON 8 MG PO TBDP: 8.0000 mg | ORAL_TABLET | Freq: Three times a day (TID) | ORAL | 0 refills | Status: DC | PRN

## 2021-03-03 MED ORDER — SODIUM CHLORIDE 0.9 % IV BOLUS (SEPSIS)
1000.0000 mL | Freq: Once | INTRAVENOUS | Status: AC
  Administered 2021-03-03: 1000 mL via INTRAVENOUS

## 2021-03-03 NOTE — Discharge Instructions (Addendum)
Take the medications as prescribed.  Follow-up with your doctor next week to be rechecked if the symptoms have not resolved

## 2021-03-03 NOTE — ED Provider Notes (Signed)
[facility] EMERGENCY DEPARTMENT Provider Note   CSN: [id number] Arrival date & time: 03/03/21  2115     History Chief Complaint  Patient presents with  . Abdominal Pain    James [name]. is a 70 y.o. male.  HPI   Patient presents to the ED for evaluation of nausea and diarrhea.  Patient states symptoms started on Thursday.  Since then he has had numerous episodes of loose stools.  Patient states he had an episode of vomiting initially but the last couple days.  He denies any abdominal pain.  He has been taking Pepto-Bismol.  He has noticed that his stool start looked dark after taking the Pepto-Bismol.  He denies any recent antibiotics.  No fevers or chills.  Past Medical History:  Diagnosis Date  . Diabetes mellitus   . GERD (gastroesophageal reflux disease)   . HLD (hyperlipidemia)   . Hydrocele   . Hypertension     Patient Active Problem List   Diagnosis Date Noted  . Hypertension 02/22/2011  . Diabetes mellitus type II 02/22/2011  . Anemia, unspecified 02/22/2011  . Enteritis 02/22/2011    Past Surgical History:  Procedure Laterality Date  . APPENDECTOMY    . HEMORRHOID SURGERY         Family History  Problem Relation Age of Onset  . Brain cancer Brother        deceased age 22  . Lung cancer Brother        deceased age 43  . Diabetes Mother   . Heart disease Mother        CHF, deceased 55  . Diabetes Father   . Kidney failure Father        deceased 25  . Colon cancer Neg Hx     Social History   Tobacco Use  . Smoking status: Never Smoker  . Smokeless tobacco: Never Used  Substance Use Topics  . Alcohol use: No    Alcohol/week: 0.0 standard drinks  . Drug use: No    Home Medications Prior to Admission medications   Medication Sig Start Date End Date Taking? Authorizing Provider  loperamide (IMODIUM) 2 MG capsule Take 1 capsule (2 mg total) by mouth 4 (four) times daily as needed for diarrhea or loose stools. 03/03/21   Yes Linwood Dibbles, MD  methylcellulose (CITRUCEL) oral powder Take 1 packet by mouth daily. 03/03/21  Yes Linwood Dibbles, MD  ondansetron (ZOFRAN ODT) 8 MG disintegrating tablet Take 1 tablet (8 mg total) by mouth every 8 (eight) hours as needed for nausea or vomiting. 03/03/21  Yes Linwood Dibbles, MD  acetaminophen (TYLENOL) 500 MG tablet Take 1 tablet (500 mg total) by mouth every 6 (six) hours as needed for mild pain. Patient not taking: Reported on 07/30/2017 03/05/17   Everlene Farrier, PA-C  aspirin EC 81 MG tablet Take 81 mg by mouth daily.     [provider]  benzonatate (TESSALON) 100 MG capsule Take 1 capsule (100 mg total) by mouth every 8 (eight) hours. 12/17/20   Lorelee New, PA-C  ferrous sulfate 325 (65 FE) MG tablet Take 325 mg by mouth daily with breakfast.    [provider]  folic acid (FOLVITE) 1 MG tablet Take 1 tablet by mouth once daily 09/29/20   Josph Macho, MD  glipiZIDE (GLUCOTROL XL) 10 MG 24 hr tablet Take 10 mg by mouth daily.     [provider]  hydrochlorothiazide (HYDRODIURIL) 25 MG tablet Take  25 mg by mouth daily. 07/03/17   [provider]  metFORMIN (GLUCOPHAGE-XR) 750 MG 24 hr tablet Take 1,500 mg by mouth at bedtime.     [provider]  Molnupiravir 200 MG CAPS TAKE 4 CAPSULES BY MOUTH 2 TIMES DAILY FOR 5 DAYS 12/18/20 12/18/21  Morton Stall, NP  Multiple Vitamin (MULTIVITAMIN WITH MINERALS) TABS Take 1 tablet by mouth daily. Men's One a Day    [provider]  naproxen (NAPROSYN) 500 MG tablet Take 1 tablet (500 mg total) by mouth 2 (two) times daily. Patient not taking: Reported on 03/05/2017 01/01/17   Audry Pili, PA-C  omeprazole (PRILOSEC) 20 MG capsule Take 1 capsule (20 mg total) by mouth daily. Patient not taking: Reported on 04/13/2018 07/30/17   Elson Areas, PA-C  simvastatin (ZOCOR) 20 MG tablet Take 20 mg by mouth daily. 05/10/17   [provider]  telmisartan (MICARDIS) 80 MG tablet Take 80 mg  by mouth daily.     [provider]  vitamin C (ASCORBIC ACID) 500 MG tablet Take 500 mg by mouth daily.    [provider]    Allergies    Hydrocodone, Amoxicillin, Penicillins, and Percocet [oxycodone-acetaminophen]  Review of Systems   Review of Systems  All other systems reviewed and are negative.   Physical Exam Updated Vital Signs BP 123/90   Pulse 65   Temp (!) 97.5 F (36.4 C) (Oral)   Resp 15   Ht 1.651 m (5\' 5" )   Wt 78.5 kg   SpO2 99%   BMI 28.79 kg/m   Physical Exam Vitals and nursing note reviewed.  Constitutional:      General: He is not in acute distress.    Appearance: He is well-developed.  HENT:     Head: Normocephalic and atraumatic.     Right Ear: External ear normal.     Left Ear: External ear normal.  Eyes:     General: No scleral icterus.       Right eye: No discharge.        Left eye: No discharge.     Conjunctiva/sclera: Conjunctivae normal.  Neck:     Trachea: No tracheal deviation.  Cardiovascular:     Rate and Rhythm: Normal rate and regular rhythm.  Pulmonary:     Effort: Pulmonary effort is normal. No respiratory distress.     Breath sounds: Normal breath sounds. No stridor. No wheezing or rales.  Abdominal:     General: Bowel sounds are normal. There is no distension.     Palpations: Abdomen is soft.     Tenderness: There is no abdominal tenderness. There is no guarding or rebound.  Musculoskeletal:        General: No tenderness.     Cervical back: Neck supple.  Skin:    General: Skin is warm and dry.     Findings: No rash.  Neurological:     Mental Status: He is alert.     Cranial Nerves: No cranial nerve deficit (no facial droop, extraocular movements intact, no slurred speech).     Sensory: No sensory deficit.     Motor: No abnormal muscle tone or seizure activity.     Coordination: Coordination normal.     ED Results / Procedures / Treatments   Labs (all labs ordered are listed, but only abnormal  results are displayed) Labs Reviewed  COMPREHENSIVE METABOLIC PANEL - Abnormal; Notable for the following components:      Result Value  Glucose, Bld 126 (*)    All other components within normal limits  CBC - Abnormal; Notable for the following components:   Hemoglobin 12.8 (*)    MCV 74.7 (*)    MCH 22.8 (*)    All other components within normal limits  LIPASE, BLOOD  URINALYSIS, ROUTINE W REFLEX MICROSCOPIC    EKG None  Radiology No results found.  Procedures Procedures   Medications Ordered in ED Medications  sodium chloride 0.9 % bolus 1,000 mL (1,000 mLs Intravenous New Bag/Given 03/03/21 2239)    Followed by  0.9 %  sodium chloride infusion (1,000 mLs Intravenous New Bag/Given 03/03/21 2238)  ondansetron (ZOFRAN) injection 4 mg (4 mg Intravenous Given 03/03/21 2239)  loperamide (IMODIUM) capsule 4 mg (4 mg Oral Given 03/03/21 2240)    ED Course  I have reviewed the triage vital signs and the nursing notes.  Pertinent labs & imaging results that were available during my care of the patient were reviewed by me and considered in my medical decision making (see chart for details).  Clinical Course as of 03/03/21 2313  Sat Mar 03, 2021  2239 Labs reviewed.  Electrolyte panel unremarkable.  Urine test is normal [JK]    Clinical Course User Index [JK] Linwood Dibbles, MD   MDM Rules/Calculators/A&P                          Patient presented to ED for evaluation of persistent diarrhea for the last several days.  Patient has not been on antibiotics.  The respecters for C. difficile.  Patient is not having abdominal pain.  Abdominal exam is benign.  Laboratory tests are otherwise reassuring.  No signs of severe dehydration.  Patient has remained stable here in the ED.  Will discharge home with prescription for Imodium.  Also recommend Citrucel.  Outpatient follow-up if symptoms persist Final Clinical Impression(s) / ED Diagnoses Final diagnoses:  Diarrhea, unspecified type     Rx / DC Orders ED Discharge Orders         Ordered    methylcellulose (CITRUCEL) oral powder  Daily        03/03/21 2313    loperamide (IMODIUM) 2 MG capsule  4 times daily PRN        03/03/21 2313    ondansetron (ZOFRAN ODT) 8 MG disintegrating tablet  Every 8 hours PRN        03/03/21 2313           Linwood Dibbles, MD 03/03/21 2314

## 2021-03-03 NOTE — ED Triage Notes (Signed)
Patient reports abdominal pain with N/V/D, denies fevers at home, denies any sick contacts

## 2021-03-03 NOTE — ED Notes (Signed)
Pt denies nausea and vomiting at this time. 

## 2021-06-14 ENCOUNTER — Other Ambulatory Visit: Payer: Self-pay | Admitting: Family

## 2021-06-14 DIAGNOSIS — D649 Anemia, unspecified: Secondary | ICD-10-CM

## 2021-07-07 ENCOUNTER — Emergency Department (HOSPITAL_COMMUNITY)
Admission: EM | Admit: 2021-07-07 | Discharge: 2021-07-08 | Disposition: A | Discharge status: Home / Self Care | Payer: BC Managed Care – PPO | Attending: Emergency Medicine | Admitting: Emergency Medicine

## 2021-07-07 ENCOUNTER — Encounter (HOSPITAL_COMMUNITY): Payer: Self-pay | Admitting: Emergency Medicine

## 2021-07-07 ENCOUNTER — Emergency Department (HOSPITAL_COMMUNITY): Payer: BC Managed Care – PPO

## 2021-07-07 DIAGNOSIS — E119 Type 2 diabetes mellitus without complications: Secondary | ICD-10-CM | POA: Diagnosis not present

## 2021-07-07 DIAGNOSIS — N433 Hydrocele, unspecified: Secondary | ICD-10-CM | POA: Diagnosis not present

## 2021-07-07 DIAGNOSIS — I10 Essential (primary) hypertension: Secondary | ICD-10-CM | POA: Diagnosis not present

## 2021-07-07 DIAGNOSIS — N5089 Other specified disorders of the male genital organs: Secondary | ICD-10-CM

## 2021-07-07 DIAGNOSIS — Z7984 Long term (current) use of oral hypoglycemic drugs: Secondary | ICD-10-CM | POA: Diagnosis not present

## 2021-07-07 DIAGNOSIS — Z7982 Long term (current) use of aspirin: Secondary | ICD-10-CM | POA: Insufficient documentation

## 2021-07-07 DIAGNOSIS — Z79899 Other long term (current) drug therapy: Secondary | ICD-10-CM | POA: Insufficient documentation

## 2021-07-07 LAB — URINALYSIS, ROUTINE W REFLEX MICROSCOPIC
Bilirubin Urine: NEGATIVE
Glucose, UA: NEGATIVE mg/dL
Hgb urine dipstick: NEGATIVE
Ketones, ur: NEGATIVE mg/dL
Leukocytes,Ua: NEGATIVE
Nitrite: NEGATIVE
Protein, ur: NEGATIVE mg/dL
Specific Gravity, Urine: 1.015 (ref 1.005–1.030)
pH: 6 (ref 5.0–8.0)

## 2021-07-07 NOTE — ED Triage Notes (Signed)
Pt here from home with c/o right side testicle swelling that started today at work , no bleeding or discharge noted

## 2021-07-08 NOTE — ED Notes (Signed)
Called pt x2 for vitals, no response. °

## 2021-07-09 NOTE — ED Provider Notes (Signed)
[facility] EMERGENCY DEPARTMENT Provider Note   CSN: [id number] Arrival date & time: 07/07/21  1923     History No chief complaint on file.   James [name]. is a 70 y.o. male.  HPI     70yo male with history of DM, htn, hlpd, presents with concern for testicular swelling and pain.  Today while he was at work, developed right sided swelling.  Moderate pain. No trauma, no fevers, no abdominal pain, nausea or vomiting. No penile discharge or dysuria.  Had an episode previously and was told to wear supportive underwear which he has been wearing.   Past Medical History:  Diagnosis Date   Diabetes mellitus    GERD (gastroesophageal reflux disease)    HLD (hyperlipidemia)    Hydrocele    Hypertension     Patient Active Problem List   Diagnosis Date Noted   Hypertension 02/22/2011   Diabetes mellitus type II 02/22/2011   Anemia, unspecified 02/22/2011   Enteritis 02/22/2011    Past Surgical History:  Procedure Laterality Date   APPENDECTOMY     HEMORRHOID SURGERY         Family History  Problem Relation Age of Onset   Brain cancer Brother        deceased age 80   Lung cancer Brother        deceased age 68   Diabetes Mother    Heart disease Mother        CHF, deceased 72   Diabetes Father    Kidney failure Father        deceased 31   Colon cancer Neg Hx     Social History   Tobacco Use   Smoking status: Never   Smokeless tobacco: Never  Substance Use Topics   Alcohol use: No    Alcohol/week: 0.0 standard drinks   Drug use: No    Home Medications Prior to Admission medications   Medication Sig Start Date End Date Taking? Authorizing Provider  acetaminophen (TYLENOL) 500 MG tablet Take 1 tablet (500 mg total) by mouth every 6 (six) hours as needed for mild pain. Patient not taking: Reported on 07/30/2017 03/05/17   Everlene Farrier, PA-C  aspirin EC 81 MG tablet Take 81 mg by mouth daily.     [provider]  benzonatate  (TESSALON) 100 MG capsule Take 1 capsule (100 mg total) by mouth every 8 (eight) hours. 12/17/20   Lorelee New, PA-C  ferrous sulfate 325 (65 FE) MG tablet Take 325 mg by mouth daily with breakfast.    [provider]  folic acid (FOLVITE) 1 MG tablet Take 1 tablet by mouth once daily 06/14/21   Josph Macho, MD  glipiZIDE (GLUCOTROL XL) 10 MG 24 hr tablet Take 10 mg by mouth daily.     [provider]  hydrochlorothiazide (HYDRODIURIL) 25 MG tablet Take 25 mg by mouth daily. 07/03/17   [provider]  loperamide (IMODIUM) 2 MG capsule Take 1 capsule (2 mg total) by mouth 4 (four) times daily as needed for diarrhea or loose stools. 03/03/21   Linwood Dibbles, MD  metFORMIN (GLUCOPHAGE-XR) 750 MG 24 hr tablet Take 1,500 mg by mouth at bedtime.     [provider]  methylcellulose (CITRUCEL) oral powder Take 1 packet by mouth daily. 03/03/21   Linwood Dibbles, MD  Molnupiravir 200 MG CAPS TAKE 4 CAPSULES BY MOUTH 2 TIMES DAILY FOR 5 DAYS 12/18/20 12/18/21  Morton Stall, NP  Multiple Vitamin (MULTIVITAMIN WITH MINERALS) TABS Take 1 tablet by mouth daily. Men's One a Day    [provider]  naproxen (NAPROSYN) 500 MG tablet Take 1 tablet (500 mg total) by mouth 2 (two) times daily. Patient not taking: Reported on 03/05/2017 01/01/17   Audry Pili, PA-C  omeprazole (PRILOSEC) 20 MG capsule Take 1 capsule (20 mg total) by mouth daily. Patient not taking: Reported on 04/13/2018 07/30/17   Elson Areas, PA-C  ondansetron (ZOFRAN ODT) 8 MG disintegrating tablet Take 1 tablet (8 mg total) by mouth every 8 (eight) hours as needed for nausea or vomiting. 03/03/21   Linwood Dibbles, MD  simvastatin (ZOCOR) 20 MG tablet Take 20 mg by mouth daily. 05/10/17   [provider]  telmisartan (MICARDIS) 80 MG tablet Take 80 mg by mouth daily.     [provider]  vitamin C (ASCORBIC ACID) 500 MG tablet Take 500 mg by mouth daily.    [provider]    Allergies     Hydrocodone, Amoxicillin, Penicillins, and Percocet [oxycodone-acetaminophen]  Review of Systems   Review of Systems  Constitutional:  Negative for fever.  Eyes:  Negative for visual disturbance.  Respiratory:  Negative for shortness of breath.   Cardiovascular:  Negative for chest pain.  Gastrointestinal:  Negative for abdominal pain, nausea and vomiting.  Genitourinary:  Positive for scrotal swelling. Negative for difficulty urinating, hematuria, penile discharge, penile pain and penile swelling.  Skin:  Negative for rash.  Neurological:  Negative for syncope and headaches.   Physical Exam Updated Vital Signs BP (!) 136/96   Pulse 64   Temp 98.6 F (37 C) (Oral)   Resp 16   SpO2 99%   Physical Exam Vitals and nursing note reviewed.  Constitutional:      General: He is not in acute distress.    Appearance: Normal appearance. He is not ill-appearing, toxic-appearing or diaphoretic.  HENT:     Head: Normocephalic.  Eyes:     Conjunctiva/sclera: Conjunctivae normal.  Cardiovascular:     Rate and Rhythm: Normal rate and regular rhythm.     Pulses: Normal pulses.  Pulmonary:     Effort: Pulmonary effort is normal. No respiratory distress.  Abdominal:     Hernia: There is no hernia in the left inguinal area or right inguinal area.  Genitourinary:    Testes:        Right: Tenderness and swelling present.  Musculoskeletal:        General: No deformity or signs of injury.     Cervical back: No rigidity.  Skin:    General: Skin is warm and dry.     Coloration: Skin is not jaundiced or pale.  Neurological:     General: No focal deficit present.     Mental Status: He is alert and oriented to person, place, and time.    ED Results / Procedures / Treatments   Labs (all labs ordered are listed, but only abnormal results are displayed) Labs Reviewed  URINALYSIS, ROUTINE W REFLEX MICROSCOPIC    EKG None  Radiology US SCROTUM DOPPLER  Result Date: 07/07/2021 CLINICAL  DATA:  Scrotal swelling x1 day. EXAM: SCROTAL ULTRASOUND DOPPLER ULTRASOUND OF THE TESTICLES TECHNIQUE: Complete ultrasound examination of the testicles, epididymis, and other scrotal structures was performed. Color and spectral Doppler ultrasound were also utilized to evaluate blood flow to the testicles. COMPARISON:  December 04, 2010 FINDINGS: Right testicle Measurements: 3.5 cm x 2.4 cm x 2.4 cm.  No mass or microlithiasis visualized. Left testicle Measurements: 3.2 cm x 2.3 cm x 2.4 cm. No mass or microlithiasis visualized. Right epididymis: A 1.4 mm x 1.4 mm x 2.3 mm cyst is noted within the head of the right epididymis. Left epididymis:  Normal in size and appearance. Hydrocele: A large, simple right-sided hydrocele is seen. This measures approximately 7.1 cm x 5.3 cm x 7.1 cm and is seen on the prior exam (measured approximately 0.9 cm x 0.5 cm x 0.6 cm on the prior study). A very small left hydrocele is also noted. Varicocele:  None visualized. Pulsed Doppler interrogation of both testes demonstrates normal low resistance arterial and venous waveforms bilaterally. IMPRESSION: 1. Large, simple right-sided hydrocele, increased in size when compared to the prior study. 2. Normal bilateral testicular flow. Electronically Signed   By: Aram Candela M.D.   On: 07/07/2021 20:33    Procedures Procedures   Medications Ordered in ED Medications - No data to display  ED Course  I have reviewed the triage vital signs and the nursing notes.  Pertinent labs & imaging results that were available during my care of the patient were reviewed by me and considered in my medical decision making (see chart for details).    MDM Rules/Calculators/A&P                            70yo male with history of DM, htn, hlpd, presents with concern for testicular swelling and pain.  UA without abnormalities. Scrotal US shows no evidence of torsion, epididymitis. No signs of cellulitis, afebrile, no doubt abscess. US  shows large simple right sided hydrocele, increased compared to prior. Given number for Urology follow up, recommend continued supportive care. Patient discharged in stable condition with understanding of reasons to return.   Final Clinical Impression(s) / ED Diagnoses Final diagnoses:  Scrotal swelling  Hydrocele, unspecified hydrocele type    Rx / DC Orders ED Discharge Orders     None        Alvira Monday, MD 07/09/21 [phone]

## 2021-08-15 ENCOUNTER — Other Ambulatory Visit: Payer: Self-pay | Admitting: Physician Assistant

## 2021-08-15 NOTE — Telephone Encounter (Signed)
Prescription denied.  Patient is no longer under providers care.//AB/CMA

## 2021-11-25 HISTORY — PX: COLONOSCOPY: SHX174

## 2023-01-26 ENCOUNTER — Other Ambulatory Visit: Payer: Self-pay

## 2023-01-26 ENCOUNTER — Emergency Department (HOSPITAL_COMMUNITY)
Admission: EM | Admit: 2023-01-26 | Discharge: 2023-01-26 | Disposition: A | Discharge status: Home / Self Care | Payer: BC Managed Care – PPO | Attending: Emergency Medicine | Admitting: Emergency Medicine

## 2023-01-26 ENCOUNTER — Encounter (HOSPITAL_COMMUNITY): Payer: Self-pay | Admitting: *Deleted

## 2023-01-26 DIAGNOSIS — X500XXA Overexertion from strenuous movement or load, initial encounter: Secondary | ICD-10-CM | POA: Diagnosis not present

## 2023-01-26 DIAGNOSIS — Z7982 Long term (current) use of aspirin: Secondary | ICD-10-CM | POA: Diagnosis not present

## 2023-01-26 DIAGNOSIS — I10 Essential (primary) hypertension: Secondary | ICD-10-CM | POA: Diagnosis not present

## 2023-01-26 DIAGNOSIS — Z79899 Other long term (current) drug therapy: Secondary | ICD-10-CM | POA: Insufficient documentation

## 2023-01-26 DIAGNOSIS — S46911A Strain of unspecified muscle, fascia and tendon at shoulder and upper arm level, right arm, initial encounter: Secondary | ICD-10-CM | POA: Diagnosis not present

## 2023-01-26 DIAGNOSIS — Y990 Civilian activity done for income or pay: Secondary | ICD-10-CM | POA: Diagnosis not present

## 2023-01-26 DIAGNOSIS — Z7984 Long term (current) use of oral hypoglycemic drugs: Secondary | ICD-10-CM | POA: Diagnosis not present

## 2023-01-26 DIAGNOSIS — M25511 Pain in right shoulder: Secondary | ICD-10-CM | POA: Diagnosis present

## 2023-01-26 DIAGNOSIS — E119 Type 2 diabetes mellitus without complications: Secondary | ICD-10-CM | POA: Diagnosis not present

## 2023-01-26 MED ORDER — TIZANIDINE HCL 4 MG PO TABS
4.0000 mg | ORAL_TABLET | Freq: Once | ORAL | Status: AC
  Administered 2023-01-26: 4 mg via ORAL
  Filled 2023-01-26: qty 1

## 2023-01-26 MED ORDER — IBUPROFEN 400 MG PO TABS
400.0000 mg | ORAL_TABLET | Freq: Once | ORAL | Status: AC
  Administered 2023-01-26: 400 mg via ORAL
  Filled 2023-01-26: qty 1

## 2023-01-26 MED ORDER — TIZANIDINE HCL 2 MG PO TABS: 2.0000 mg | ORAL_TABLET | Freq: Three times a day (TID) | ORAL | 0 refills | Status: DC | PRN

## 2023-01-26 MED ORDER — ACETAMINOPHEN 325 MG PO TABS
650.0000 mg | ORAL_TABLET | Freq: Once | ORAL | Status: AC
  Administered 2023-01-26: 650 mg via ORAL
  Filled 2023-01-26: qty 2

## 2023-01-26 NOTE — ED Triage Notes (Signed)
The pt is c/o rt shoulder pain  he lifts heavy equipment at work  he was seen yesterday at the urgent care where they did an xray and gave him a muscle rrelaxer to take.  The muscle relaxer did not help  his pain is worse he was unable to sleep tonight

## 2023-01-26 NOTE — ED Provider Notes (Signed)
James [name] Provider Note   CSN: [id number] Arrival date & time: 01/26/23  [id number]     History  Chief Complaint  Patient presents with   Shoulder Pain    James [name]. is a 72 y.o. male.  The history is provided by the patient.  Shoulder Pain James [name]. is a 72 y.o. male who presents to the Emergency Department complaining of shoulder pain.  He presents to the emergency department for evaluation of right shoulder pain that started 2 weeks ago after lifting something heavy at work.  Pain is located in the right shoulder/deltoid region and radiates down to the hand.  It is worse when he moves his arm and elevates his arm above his head.  He has no associated chest pain, fevers, difficulty breathing, abdominal pain, nausea, vomiting, weakness, numbness.  He is right-hand dominant.  He has a history of hypertension, diabetes.  He went to urgent care yesterday and had an x-ray performed and was prescribed Robaxin.  He took 1 dose last night and felt like his pain was not any better.      Home Medications Prior to Admission medications   Medication Sig Start Date End Date Taking? Authorizing Provider  tiZANidine (ZANAFLEX) 2 MG tablet Take 1 tablet (2 mg total) by mouth every 8 (eight) hours as needed for muscle spasms. 01/26/23  Yes Quintella Reichert, MD  acetaminophen (TYLENOL) 500 MG tablet Take 1 tablet (500 mg total) by mouth every 6 (six) hours as needed for mild pain. Patient not taking: Reported on 07/30/2017 03/05/17   Waynetta Pean, PA-C  aspirin EC 81 MG tablet Take 81 mg by mouth daily.     [provider]  benzonatate (TESSALON) 100 MG capsule Take 1 capsule (100 mg total) by mouth every 8 (eight) hours. 12/17/20   Corena Herter, PA-C  ferrous sulfate 325 (65 FE) MG tablet Take 325 mg by mouth daily with breakfast.    [provider]  folic acid (FOLVITE) 1 MG tablet Take 1 tablet by mouth once daily 06/14/21    Volanda Napoleon, MD  glipiZIDE (GLUCOTROL XL) 10 MG 24 hr tablet Take 10 mg by mouth daily.     [provider]  hydrochlorothiazide (HYDRODIURIL) 25 MG tablet Take 25 mg by mouth daily. 07/03/17   [provider]  loperamide (IMODIUM) 2 MG capsule Take 1 capsule (2 mg total) by mouth 4 (four) times daily as needed for diarrhea or loose stools. 03/03/21   Dorie Rank, MD  metFORMIN (GLUCOPHAGE-XR) 750 MG 24 hr tablet Take 1,500 mg by mouth at bedtime.     [provider]  methylcellulose (CITRUCEL) oral powder Take 1 packet by mouth daily. 03/03/21   Dorie Rank, MD  Multiple Vitamin (MULTIVITAMIN WITH MINERALS) TABS Take 1 tablet by mouth daily. Men's One a Day    [provider]  naproxen (NAPROSYN) 500 MG tablet Take 1 tablet (500 mg total) by mouth 2 (two) times daily. Patient not taking: Reported on 03/05/2017 01/01/17   Shary Decamp, PA-C  omeprazole (PRILOSEC) 20 MG capsule Take 1 capsule (20 mg total) by mouth daily. Patient not taking: Reported on 04/13/2018 07/30/17   Fransico Meadow, PA-C  ondansetron (ZOFRAN ODT) 8 MG disintegrating tablet Take 1 tablet (8 mg total) by mouth every 8 (eight) hours as needed for nausea or vomiting. 03/03/21   Dorie Rank, MD  simvastatin (ZOCOR) 20 MG tablet Take 20  mg by mouth daily. 05/10/17   [provider]  telmisartan (MICARDIS) 80 MG tablet Take 80 mg by mouth daily.     [provider]  vitamin C (ASCORBIC ACID) 500 MG tablet Take 500 mg by mouth daily.    [provider]      Allergies    Hydrocodone, Amoxicillin, Penicillins, and Percocet [oxycodone-acetaminophen]    Review of Systems   Review of Systems  All other systems reviewed and are negative.   Physical Exam Updated Vital Signs BP 129/81 (BP Location: Left Arm)   Pulse (!) 101   Temp 98.2 F (36.8 C) (Oral)   Resp 20   Ht '5\' 5"'$  (1.651 m)   Wt 78.5 kg   SpO2 91%   BMI 28.80 kg/m  Physical Exam Vitals and nursing note  reviewed.  Constitutional:      Appearance: He is well-developed.  HENT:     Head: Normocephalic and atraumatic.  Cardiovascular:     Rate and Rhythm: Normal rate and regular rhythm.     Heart sounds: No murmur heard. Pulmonary:     Effort: Pulmonary effort is normal. No respiratory distress.     Breath sounds: Normal breath sounds.  Abdominal:     Palpations: Abdomen is soft.     Tenderness: There is no abdominal tenderness. There is no guarding or rebound.  Musculoskeletal:        General: No tenderness.     Comments: 2+ radial pulses bilaterally.  There is no soft tissue swelling to the right shoulder and no appreciable tenderness on direct palpation throughout the shoulder and right upper extremity.  There is pain on passive and active range of motion of the right shoulder.  Skin:    General: Skin is warm and dry.  Neurological:     Mental Status: He is alert and oriented to person, place, and time.     Comments: Sensation to light touch intact throughout the right upper extremity with 5 out of 5 grip strength in the right upper extremity.  Psychiatric:        Behavior: Behavior normal.     ED Results / Procedures / Treatments   Labs (all labs ordered are listed, but only abnormal results are displayed) Labs Reviewed - No data to display  EKG None  Radiology No results found.  Procedures Procedures    Medications Ordered in ED Medications  acetaminophen (TYLENOL) tablet 650 mg (has no administration in time range)  ibuprofen (ADVIL) tablet 400 mg (has no administration in time range)  tiZANidine (ZANAFLEX) tablet 4 mg (has no administration in time range)    ED Course/ Medical Decision Making/ A&P                             Medical Decision Making Risk OTC drugs. Prescription drug management.   Patient here for evaluation of persistent right shoulder pain after an injury that he sustained 2 weeks ago.  He does have pain on range of motion but no  significant tenderness on palpation.  He did have plain films obtained at urgent care yesterday-records reviewed in epic, able to view the results.  Current clinical picture is not consistent with septic or gouty arthritis, referred cardiac pain.  Discussed with patient home care for musculoskeletal pain.  He will need orthopedics follow-up due to ongoing symptoms.  Discussed that he can continue taking acetaminophen 3 times daily.  He may add  ibuprofen twice daily as well as over-the-counter creams such as Lidoderm.  Will prescribe an alternative muscle relaxer that he may take instead of the Robaxin.  Discussed outpatient follow-up and return precautions.  Would defer steroids at this time as patient does have a history of diabetes.        Final Clinical Impression(s) / ED Diagnoses Final diagnoses:  Strain of right shoulder, initial encounter    Rx / DC Orders ED Discharge Orders          Ordered    tiZANidine (ZANAFLEX) 2 MG tablet  Every 8 hours PRN        01/26/23 0436              Quintella Reichert, MD 01/26/23 [phone]

## 2023-01-28 ENCOUNTER — Ambulatory Visit (INDEPENDENT_AMBULATORY_CARE_PROVIDER_SITE_OTHER): Payer: BC Managed Care – PPO | Admitting: Orthopedic Surgery

## 2023-01-28 ENCOUNTER — Encounter: Payer: Self-pay | Admitting: Orthopedic Surgery

## 2023-01-28 ENCOUNTER — Ambulatory Visit (INDEPENDENT_AMBULATORY_CARE_PROVIDER_SITE_OTHER): Payer: BC Managed Care – PPO

## 2023-01-28 VITALS — BP 121/84 | HR 93 | Ht 65.0 in | Wt 173.0 lb

## 2023-01-28 DIAGNOSIS — M25511 Pain in right shoulder: Secondary | ICD-10-CM

## 2023-01-28 MED ORDER — NAPROXEN 500 MG PO TABS: 500.0000 mg | ORAL_TABLET | Freq: Two times a day (BID) | ORAL | 0 refills | Status: AC

## 2023-01-28 NOTE — Progress Notes (Signed)
Orthopedic Surgery Progress Note   Assessment: Patient is a 72 y.o. male with right shoulder pain that started about 2 weeks ago. Has positive Speeds and TTP over the bicipital groove   Plan: -No acute operative intervention -WBAT RUE -Recommended naproxen BID for 14 day course, home exercise program, and biceps tendon injection with Dr. Rolena Infante -Patient has tried muscle relaxer, tylenol, and activity modification -Discussed that his radiating pain down his arm is not consistent with biceps pathology. This may be related to a radiculopathy -Follow up in 4 weeks, repeat XRs at next visit: none  ___________________________________________________________________________  Subjective: Patient has had shoulder pain for the last two weeks. He was at work when he was lifting a heavier object for a sustained period of time. He was seen at the ER for this problem and told to follow up with orthopedics. His pain is over the anterior aspect of the shoulder. It is worse with overhead activity and night time. He has pain that sometimes radiates down the dorsal aspect of the arm to the level of the wrist. No left sided symptoms. No neck pain. Denies numbness or paresthesias.   History of diabetes, HTN, HLD  Physical Exam:  General: no acute distress, appears stated age Neurologic: alert, answering questions appropriately, following commands Respiratory: unlabored breathing on room air, symmetric chest rise Psychiatric: appropriate affect, normal cadence to speech  MSK:   -Right upper extremity  TTP over the bicipital groove, no other tenderness to palpation over the shoulder, positive speeds, negative jobe, negative drop arm sign, negative belly press, no weakness with external rotation with arm at side, negative yergason, negative hawkins, no pain through range of motion  Negative shoulder abduction relief, negative spurlings bilaterally  5/5 strength in all myotomes in the extremity  AIN/PIN/IO  intact  Palpable radial pulse  Sensation intact to light touch in median/ulnar/radial/axillary nerve distributions  Hand warm and well perfused  XRs of the right shoulder from 01/28/2023 were independently reviewed and interpreted, showing no fracture or dislocation. No high riding humeral head. No significant degenerative changes within the glenohumeral joint.    Patient name: James [name] Patient MRN: [id number] Date: 01/28/23

## 2023-02-10 ENCOUNTER — Ambulatory Visit: Payer: BC Managed Care – PPO | Admitting: Sports Medicine

## 2023-02-24 ENCOUNTER — Ambulatory Visit (INDEPENDENT_AMBULATORY_CARE_PROVIDER_SITE_OTHER): Payer: BC Managed Care – PPO | Admitting: Sports Medicine

## 2023-02-24 ENCOUNTER — Other Ambulatory Visit: Payer: Self-pay

## 2023-02-24 ENCOUNTER — Ambulatory Visit: Payer: BC Managed Care – PPO | Admitting: Sports Medicine

## 2023-02-24 ENCOUNTER — Encounter: Payer: Self-pay | Admitting: Sports Medicine

## 2023-02-24 DIAGNOSIS — M7521 Bicipital tendinitis, right shoulder: Secondary | ICD-10-CM | POA: Diagnosis not present

## 2023-02-24 DIAGNOSIS — M25511 Pain in right shoulder: Secondary | ICD-10-CM | POA: Diagnosis not present

## 2023-02-24 NOTE — Progress Notes (Signed)
     Procedure Note  Patient: James TAPLEY Sr.             Date of Birth: [birth year]           MRN: [id number]             Visit Date: 02/24/2023  Imaging:  [place] Extrem Up Right Ltd Limited musculoskeletal ultrasound of the right shoulder, right biceps  tendon and associated structures were performed today.  Biceps tendon was  evaluated in both short and long axis.  Short axis evaluation shows the  bicep tendon well-seated within the humeral groove, there is no evidence  of full-thickness tearing.  Evaluation and long axis shows a mild amount  of tenosynovitis with surrounding hyperemia around the bicep tendon.  No  cortical irregularity of the coracoid or significant arthritic change of  the humerus on limited ultrasound.  Visit Diagnoses:  1. Biceps tendinitis of right shoulder   2. Acute pain of right shoulder    Procedures:  US-Guided Biceps tendon sheath injection, right shoulder  After discussion on risks/benefits/indications, informed verbal consent was obtained. A timeout was then performed. Patient was placed in supine position on the table in exam room. The patient's anterior shoulder was prepped with betadine and alcohol swabs. Utilizing ultrasound guidance, the biceps tendon sheath was identified in a long-axis view. Using a 25G, 1.5" needle under ultrasound guidance, the tendon sheath was injected from a superior-inferior direction with a 1:1:1 lidocaine:bupivicaine:depomedrol with visualization of injectate flow within the tendon sheath via an in-plane technique. Patient tolerated the procedure well without immediate complications.   -Technically successful ultrasound-guided biceps tendon sheath injection, patient tolerated well -He has follow-up with Dr. Laurance Flatten next week for reevaluation, we will guide further management per his recommendation -If for some reason he does not get good relief with this, could consider ultrasound-guided glenohumeral joint injection in  future  Elba Barman, DO [place]  This note was dictated using Dragon naturally speaking software and may contain errors in syntax, spelling, or content which have not been identified prior to signing this note.

## 2023-03-03 ENCOUNTER — Ambulatory Visit: Payer: BC Managed Care – PPO | Admitting: Orthopedic Surgery

## 2023-03-10 ENCOUNTER — Ambulatory Visit (INDEPENDENT_AMBULATORY_CARE_PROVIDER_SITE_OTHER): Payer: BC Managed Care – PPO | Admitting: Orthopedic Surgery

## 2023-03-10 ENCOUNTER — Telehealth: Payer: Self-pay | Admitting: Orthopedic Surgery

## 2023-03-10 DIAGNOSIS — M75111 Incomplete rotator cuff tear or rupture of right shoulder, not specified as traumatic: Secondary | ICD-10-CM | POA: Diagnosis not present

## 2023-03-10 NOTE — Telephone Encounter (Signed)
I called and advised that Dr. Christell Constant prescribes pain meds only during a 6 week postop period. I advised him to take tylenol, advil or aleve. He states that he will try aleve that the other 2 don't work for him.

## 2023-03-10 NOTE — Telephone Encounter (Signed)
Patient called asked if he can get something called into his pharmacy for pain? [facility] uses [facility] [address]. The number to contact patient is [phone]

## 2023-03-10 NOTE — Progress Notes (Signed)
Orthopedic Spine Surgery Office Note  Assessment: Patient is a 72 y.o. male with right shoulder pain. Has positive findings on exam that point to shoulder as etiology but would not explain his dorsal forearm pain   Plan: -Patient has tried muscle relaxer, tylenol, activity modification, naproxen, shoulder steroid injection -He has tried over six weeks of conservative treatment so recommended MRI of the shoulder to evaluate for partial thickness rotator cuff tear  -I will call the patient with the results of the MRI to go over his treatment options   Patient expressed understanding of the plan and all questions were answered to the patient's satisfaction.   __________________________________________________________________________  History: Patient is a 72 y.o. male who has been previously seen in the office for right shoulder pain. Pain is over the lateral aspect of the shoulder. It is worse with overhead activity and at night. He gets relief if he rests the arm. He does have pain that radiates down the dorsal aspect of the forearm on the right, but this pain is not nearly as significant as the shoulder pain. He tried an injection after our last visit which gave him some minimal short term relief and then pain returned. No similar symptoms on the left side. No neck pain. Denies paresthesias and numbness.   Previous treatments: muscle relaxer, tylenol, activity modification, naproxen, shoulder steroid injection  Physical Exam:  General: no acute distress, appears stated age Neurologic: alert, answering questions appropriately, following commands Respiratory: unlabored breathing on room air, symmetric chest rise Psychiatric: appropriate affect, normal cadence to speech   MSK:    -Negative Spurling bilaterally -Negative shoulder abduction relief sign -Right upper extremity             Mildly TTP over the bicipital groove, no other tenderness to palpation over the shoulder, positive  speeds, pain but no weakness with jobe, negative drop arm sign, negative belly press, no weakness with external rotation with arm at side, negative yergason, negative hawkins, pain with internal rotation past 50 degrees but no other pain through range of motion             5/5 strength in all myotomes in the extremity             AIN/PIN/IO intact             Palpable radial pulse             Sensation intact to light touch in median/ulnar/radial/axillary nerve distributions             Hand warm and well perfused   Imaging: XRs of the right shoulder from 01/28/2023 were previously independently reviewed and interpreted, showing no fracture or dislocation. No high riding humeral head. No significant degenerative changes within the glenohumeral joint.     Patient name: James [name] Patient MRN: [id number] Date of visit: 03/10/23

## 2023-03-17 ENCOUNTER — Ambulatory Visit: Payer: BC Managed Care – PPO | Admitting: Orthopedic Surgery

## 2023-03-26 ENCOUNTER — Telehealth: Payer: Self-pay | Admitting: Orthopedic Surgery

## 2023-03-26 NOTE — Telephone Encounter (Signed)
Orthopedic Surgery Telephone Note  I got the report from [facility] about the patient's MRI.  I do not have a physical copy of it.  I called the patient to go over the results of the MRI.  I explained that the radiologist saw a high-grade partial tear of the biceps tendon with tenosynovitis.  There is also partial-thickness tearing of the anterior supraspinatus.  I informed him that he does have symptoms specially the forearm pain that are not consistent with the shoulder but the majority of his pain seems to be coming from the right shoulder.  I told him we can continue to treat him with periodic injections or over-the-counter medications but if he was considering surgery, he would need to see my partner, Dr. August Saucer. He said he was going to call to get an appointment with Dr. August Saucer.   London Sheer, MD  Orthopedic Surgeon

## 2023-03-27 ENCOUNTER — Telehealth: Payer: Self-pay | Admitting: Orthopedic Surgery

## 2023-03-27 NOTE — Telephone Encounter (Signed)
Patient states Dr. Christell Constant called and told him to send Dr. August Saucer a message to go ahead with surgery on his shoulder. Please call patient on his cell number.

## 2023-04-01 ENCOUNTER — Telehealth: Payer: Self-pay | Admitting: Orthopedic Surgery

## 2023-04-01 NOTE — Telephone Encounter (Signed)
Patient asking when he will be scheduled for shoulder surgery

## 2023-04-02 NOTE — Telephone Encounter (Signed)
I am okay operating on him but he needs to come in so I can see him first I think he has only been seen by Dr. Christell Constant on my review unless I am missing something.  It looks like MRI scan from [facility] shows partial-thickness cuff tearing and some biceps tendinitis.

## 2023-04-07 ENCOUNTER — Ambulatory Visit (INDEPENDENT_AMBULATORY_CARE_PROVIDER_SITE_OTHER): Payer: BC Managed Care – PPO | Admitting: Orthopedic Surgery

## 2023-04-07 ENCOUNTER — Encounter: Payer: Self-pay | Admitting: Orthopedic Surgery

## 2023-04-07 VITALS — Ht 65.0 in | Wt 170.0 lb

## 2023-04-07 DIAGNOSIS — M7521 Bicipital tendinitis, right shoulder: Secondary | ICD-10-CM | POA: Diagnosis not present

## 2023-04-07 DIAGNOSIS — M75111 Incomplete rotator cuff tear or rupture of right shoulder, not specified as traumatic: Secondary | ICD-10-CM | POA: Diagnosis not present

## 2023-04-07 NOTE — Progress Notes (Signed)
Office Visit Note   Patient: James SEIN Sr.           Date of Birth: Jan 22, 1951           MRN: [id number] Visit Date: 04/07/2023 Requested by: [facility] [address] [place],  [place] [address] PCP: [facility]  Subjective: Chief Complaint  Patient presents with   Right Shoulder - Follow-up    HPI: James Lafoy. is a 72 y.o. male who presents to the office reporting right shoulder pain.  Has had pain for 6 months.  Denies a history of injury.  Has a history of being in the [employer].  Reports pain with decreased range of motion.  States that it aches all the time.  He is right-hand dominant.  Denies much in the way of mechanical symptoms.  Had a cortisone injection under ultrasound guidance which did not give him too much help.  That was done 2 months ago.  Has well-controlled diabetes as well as hypertension.  He has had a nonarthrogram MRI scan at [facility] which shows high-grade partial tear of the long head of the biceps tendon with moderate tenosynovitis along with anterior supraspinatus partial tearing.  Moderate AC joint degenerative changes with mild bursitis.  Mild adhesive capsulitis..                ROS: All systems reviewed are negative as they relate to the chief complaint within the history of present illness.  Patient denies fevers or chills.  Assessment & Plan: Visit Diagnoses:  1. Nontraumatic incomplete tear of right rotator cuff   2. Biceps tendinitis of right shoulder     Plan: Impression is symptomatic biceps tendon partial tearing.  Does not really look too much like frozen shoulder at this time with fairly symmetric passive range of motion.  He has had a long history of symptoms.  Talk to him about operative and nonoperative treatment options.  He already has had an injection which did not help him very much.  I think his best course of treatment would be arthroscopy with subacromial  decompression and biceps tendon release with biceps tenodesis.  We can also examine that rotator cuff at the time of surgery but does not really look torn based on the report as well as physical exam.  The risk benefits of surgery are discussed with the patient include not limited to infection or vessel damage shoulder stiffness as well as incomplete restoration of function and a relatively long rehab time required to get back to a functional level for physical activity.  Patient understands risk benefits and wishes to proceed.  All questions answered  Follow-Up Instructions: No follow-ups on file.   Orders:  No orders of the defined types were placed in this encounter.  No orders of the defined types were placed in this encounter.     Procedures: No procedures performed   Clinical Data: No additional findings.  Objective: Vital Signs: Ht 5\' 5"  (1.651 m)   Wt 170 lb (77.1 kg)   BMI 28.29 kg/m   Physical Exam:  Constitutional: Patient appears well-developed HEENT:  Head: Normocephalic Eyes:EOM are normal Neck: Normal range of motion Cardiovascular: Normal rate Pulmonary/chest: Effort normal Neurologic: Patient is alert Skin: Skin is warm Psychiatric: Patient has normal mood and affect  Ortho Exam: Ortho exam demonstrates good cervical spine range of motion.  5 out of 5 grip EPL FPL interosseous resection extension bicep  triceps and deltoid strength.  No discrete AC joint tenderness right versus left.  Has symmetric range of motion approximately 50/90/160.  Rotator cuff strength is excellent infraspinatus supraspinatus and subscap muscle testing with no coarse grinding or crepitus with internal/external rotation of that right arm at 90 degrees of abduction.  O'Brien's testing is positive.  Pain does radiate to the biceps on the right.  Specialty Comments:  No specialty comments available.  Imaging: No results found.   PMFS History: Patient Active Problem List   Diagnosis  Date Noted   Hypertension 02/22/2011   Diabetes mellitus type II 02/22/2011   Anemia, unspecified 02/22/2011   Enteritis 02/22/2011   Past Medical History:  Diagnosis Date   Diabetes mellitus    GERD (gastroesophageal reflux disease)    HLD (hyperlipidemia)    Hydrocele    Hypertension     Family History  Problem Relation Age of Onset   Brain cancer Brother        deceased age 4   Lung cancer Brother        deceased age 25   Diabetes Mother    Heart disease Mother        CHF, deceased 34   Diabetes Father    Kidney failure Father        deceased 44   Colon cancer Neg Hx     Past Surgical History:  Procedure Laterality Date   APPENDECTOMY     HEMORRHOID SURGERY     Social History   Occupational History   Occupation: TRANSPORTATION    Employer: [employer]  Tobacco Use   Smoking status: Never   Smokeless tobacco: Never  Substance and Sexual Activity   Alcohol use: No    Alcohol/week: 0.0 standard drinks of alcohol   Drug use: No   Sexual activity: Not on file

## 2023-04-08 ENCOUNTER — Telehealth: Payer: Self-pay | Admitting: Orthopedic Surgery

## 2023-04-08 NOTE — Telephone Encounter (Signed)
Standard forms received. To [employer]. 

## 2023-04-09 NOTE — Pre-Procedure Instructions (Signed)
Surgical Instructions    Your procedure is scheduled on Apr 18, 2023.  Report to [facility] Main Entrance "A" at 5:30 A.M., then check in with the Admitting office.  Call this number if you have problems the morning of surgery:  [phone]  If you have any questions prior to your surgery date call [phone]: Open Monday-Friday 8am-4pm If you experience any cold or flu symptoms such as cough, fever, chills, shortness of breath, etc. between now and your scheduled surgery, please notify us at the above number.     Remember:  Do not eat after midnight the night before your surgery  You may drink clear liquids until 4:30 AM the morning of your surgery.   Clear liquids allowed are: Water, Non-Citrus Juices (without pulp), Carbonated Beverages, Clear Tea, Black Coffee Only (NO MILK, CREAM OR POWDERED CREAMER of any kind), and Gatorade.     Take these medicines the morning of surgery with A SIP OF WATER:  simvastatin (ZOCOR)   acetaminophen (TYLENOL) - may take if needed   Follow your surgeon's instructions on when to stop Aspirin.  If no instructions were given by your surgeon then you will need to call the office to get those instructions.    As of today, STOP taking any Aleve, Naproxen, Ibuprofen, Motrin, Advil, Goody's, BC's, all herbal medications, fish oil, and all vitamins.   WHAT DO I DO ABOUT MY DIABETES MEDICATION?   Do not take metFORMIN (GLUCOPHAGE-XR) OR  the morning of surgery.  Do not take glipiZIDE (GLUCOTROL XL) the evening before surgery or the morning of surgery.   HOW TO MANAGE YOUR DIABETES BEFORE AND AFTER SURGERY  Why is it important to control my blood sugar before and after surgery? Improving blood sugar levels before and after surgery helps healing and can limit problems. A way of improving blood sugar control is eating a healthy diet by:  Eating less sugar and carbohydrates  Increasing activity/exercise  Talking with your doctor about reaching  your blood sugar goals High blood sugars (greater than 180 mg/dL) can raise your risk of infections and slow your recovery, so you will need to focus on controlling your diabetes during the weeks before surgery. Make sure that the doctor who takes care of your diabetes knows about your planned surgery including the date and location.  How do I manage my blood sugar before surgery? Check your blood sugar at least 4 times a day, starting 2 days before surgery, to make sure that the level is not too high or low.  Check your blood sugar the morning of your surgery when you wake up and every 2 hours until you get to the Short Stay unit.  If your blood sugar is less than 70 mg/dL, you will need to treat for low blood sugar: Do not take insulin. Treat a low blood sugar (less than 70 mg/dL) with  cup of clear juice (cranberry or apple), 4 glucose tablets, OR glucose gel. Recheck blood sugar in 15 minutes after treatment (to make sure it is greater than 70 mg/dL). If your blood sugar is not greater than 70 mg/dL on recheck, call [phone] for further instructions. Report your blood sugar to the short stay nurse when you get to Short Stay.  If you are admitted to the hospital after surgery: Your blood sugar will be checked by the staff and you will probably be given insulin after surgery (instead of oral diabetes medicines) to make sure you have good blood sugar levels.  The goal for blood sugar control after surgery is 80-180 mg/dL.             Do NOT Smoke (Tobacco/Vaping) for 24 hours prior to your procedure.  If you use a CPAP at night, you may bring your mask/headgear for your overnight stay.   Contacts, glasses, piercing's, hearing aid's, dentures or partials may not be worn into surgery, please bring cases for these belongings.    For patients admitted to the hospital, discharge time will be determined by your treatment team.   Patients discharged the day of surgery will not be allowed to  drive home, and someone needs to stay with them for 24 hours.  SURGICAL WAITING ROOM VISITATION Patients having surgery or a procedure may have no more than 2 support people in the waiting area - these visitors may rotate.   Children under the age of 31 must have an adult with them who is not the patient. If the patient needs to stay at the hospital during part of their recovery, the visitor guidelines for inpatient rooms apply. Pre-op nurse will coordinate an appropriate time for 1 support person to accompany patient in pre-op.  This support person may not rotate.   Please refer to the [facility] website for the visitor guidelines for Inpatients (after your surgery is over and you are in a regular room).   If you received a COVID test during your pre-op visit  it is requested that you wear a mask when out in public, stay away from anyone that may not be feeling well and notify your surgeon if you develop symptoms. If you have been in contact with anyone that has tested positive in the last 10 days please notify you surgeon.  Pre-operative 5 CHG Bath Instructions   You can play a key role in reducing the risk of infection after surgery. Your skin needs to be as free of germs as possible. You can reduce the number of germs on your skin by washing with CHG (chlorhexidine gluconate) soap before surgery. CHG is an antiseptic soap that kills germs and continues to kill germs even after washing.  DO NOT use if you have an allergy to chlorhexidine/CHG or antibacterial soaps. If your skin becomes reddened or irritated, stop using the CHG and notify one of our RNs at [phone].   Please keep in mind the following:  TAKE A SHOWER THE NIGHT BEFORE SURGERY AND THE DAY OF SURGERY   DO NOT shave, including legs and underarms, 48 hours prior to surgery.   You may shave your face before/day of surgery.  Place clean sheets on your bed the night before surgery Use a clean washcloth (not used since being  washed) for each shower. DO NOT sleep with pet's night before surgery.  CHG Shower Instructions:  If you choose to wash your hair and private area, wash first with your normal shampoo/soap.  After you use shampoo/soap, rinse your hair and body thoroughly to remove shampoo/soap residue.  Turn the water OFF and apply about 3 tablespoons (45 ml) of CHG soap to a CLEAN washcloth.  Apply CHG soap ONLY FROM YOUR NECK DOWN TO YOUR TOES (washing for 3-5 minutes)  DO NOT use CHG soap on face, private areas, open wounds, or sores.  Pay special attention to the area where your surgery is being performed.  If you are having back surgery, having someone wash your back for you may be helpful. Wait 2 minutes after CHG soap is applied, then  you may rinse off the CHG soap.  Pat dry with a clean towel  Put on clean clothes/pajamas   If you choose to wear lotion, please use ONLY the CHG-compatible lotions on the bottom of this paper.     Additional instructions for the day of surgery: DO NOT APPLY any lotions, deodorants, cologne, or perfumes.   Do not wear jewelry or makeup Do not wear nail polish, gel polish, artificial nails, or any other type of covering on natural nails (fingers and toes) Put on clean/comfortable clothes.  Please brush your teeth.  Ask your nurse before applying any prescription medications to the skin.   CHG Compatible Lotions   Aveeno Moisturizing lotion  Cetaphil Moisturizing Cream  Cetaphil Moisturizing Lotion  Clairol Herbal Essence Moisturizing Lotion, Dry Skin  Clairol Herbal Essence Moisturizing Lotion, Extra Dry Skin  Clairol Herbal Essence Moisturizing Lotion, Normal Skin  Curel Age Defying Therapeutic Moisturizing Lotion with Alpha Hydroxy  Curel Extreme Care Body Lotion  Curel Soothing Hands Moisturizing Hand Lotion  Curel Therapeutic Moisturizing Cream, Fragrance-Free  Curel Therapeutic Moisturizing Lotion, Fragrance-Free  Curel Therapeutic Moisturizing Lotion,  Original Formula  Eucerin Daily Replenishing Lotion  Eucerin Dry Skin Therapy Plus Alpha Hydroxy Crme  Eucerin Dry Skin Therapy Plus Alpha Hydroxy Lotion  Eucerin Original Crme  Eucerin Original Lotion  Eucerin Plus Crme Eucerin Plus Lotion  Eucerin TriLipid Replenishing Lotion  Keri Anti-Bacterial Hand Lotion  Keri Deep Conditioning Original Lotion Dry Skin Formula Softly Scented  Keri Deep Conditioning Original Lotion, Fragrance Free Sensitive Skin Formula  Keri Lotion Fast Absorbing Fragrance Free Sensitive Skin Formula  Keri Lotion Fast Absorbing Softly Scented Dry Skin Formula  Keri Original Lotion  Keri Skin Renewal Lotion Keri Silky Smooth Lotion  Keri Silky Smooth Sensitive Skin Lotion  Nivea Body Creamy Conditioning Oil  Nivea Body Extra Enriched Lotion  Nivea Body Original Lotion  Nivea Body Sheer Moisturizing Lotion Nivea Crme  Nivea Skin Firming Lotion  NutraDerm 30 Skin Lotion  NutraDerm Skin Lotion  NutraDerm Therapeutic Skin Cream  NutraDerm Therapeutic Skin Lotion  ProShield Protective Hand Cream     Provon moisturizing lotion   Please read over the following fact sheets that you were given.

## 2023-04-10 ENCOUNTER — Encounter (HOSPITAL_COMMUNITY)
Admission: RE | Admit: 2023-04-10 | Discharge: 2023-04-10 | Disposition: A | Discharge status: Home / Self Care | Payer: BC Managed Care – PPO | Source: Ambulatory Visit | Attending: Orthopedic Surgery | Admitting: Orthopedic Surgery

## 2023-04-10 ENCOUNTER — Other Ambulatory Visit: Payer: Self-pay

## 2023-04-10 ENCOUNTER — Encounter (HOSPITAL_COMMUNITY): Payer: Self-pay

## 2023-04-10 VITALS — BP 149/85 | HR 83 | Temp 97.6°F | Resp 18 | Ht 65.0 in | Wt 178.8 lb

## 2023-04-10 DIAGNOSIS — Z01818 Encounter for other preprocedural examination: Secondary | ICD-10-CM | POA: Diagnosis present

## 2023-04-10 DIAGNOSIS — E119 Type 2 diabetes mellitus without complications: Secondary | ICD-10-CM | POA: Diagnosis not present

## 2023-04-10 HISTORY — DX: Unspecified osteoarthritis, unspecified site: M19.90

## 2023-04-10 LAB — CBC
HCT: 42.9 % (ref 39.0–52.0)
Hemoglobin: 12.7 g/dL — ABNORMAL LOW (ref 13.0–17.0)
MCH: 22.9 pg — ABNORMAL LOW (ref 26.0–34.0)
MCHC: 29.6 g/dL — ABNORMAL LOW (ref 30.0–36.0)
MCV: 77.3 fL — ABNORMAL LOW (ref 80.0–100.0)
Platelets: 259 10*3/uL (ref 150–400)
RBC: 5.55 MIL/uL (ref 4.22–5.81)
RDW: 14.9 % (ref 11.5–15.5)
WBC: 5 10*3/uL (ref 4.0–10.5)
nRBC: 0 % (ref 0.0–0.2)

## 2023-04-10 LAB — GLUCOSE, CAPILLARY: Glucose-Capillary: 169 mg/dL — ABNORMAL HIGH (ref 70–99)

## 2023-04-10 LAB — BASIC METABOLIC PANEL
Anion gap: 7 (ref 5–15)
BUN: 10 mg/dL (ref 8–23)
CO2: 28 mmol/L (ref 22–32)
Calcium: 8.6 mg/dL — ABNORMAL LOW (ref 8.9–10.3)
Chloride: 100 mmol/L (ref 98–111)
Creatinine, Ser: 1.17 mg/dL (ref 0.61–1.24)
GFR, Estimated: >60 mL/min (ref >60)
Glucose, Bld: 122 mg/dL — ABNORMAL HIGH (ref 70–99)
Potassium: 4.2 mmol/L (ref 3.5–5.1)
Sodium: 135 mmol/L (ref 135–145)

## 2023-04-10 LAB — HEMOGLOBIN A1C
Hgb A1c MFr Bld: 6.7 % — ABNORMAL HIGH (ref 4.8–5.6)
Mean Plasma Glucose: 145.59 mg/dL

## 2023-04-10 NOTE — Progress Notes (Signed)
PCP - Renelda Loma, NP Cardiologist - Denies  PPM/ICD - Denies Device Orders - n/a Rep Notified - n/a  Chest x-ray - n/a EKG - 04/10/2023 Stress Test - Denies ECHO - 04/25/2017 Cardiac Cath - Denies  Sleep Study - Denies CPAP - n/a  Pt is DM2. He checks his blood sugar 1x/day. Normal fasting blood sugar is around 90. CBG at pre-op appointment 169. Pt has not had lunch, but had cereal and a steak biscuit for breakfast. A1c result pending.   Last dose of GLP1 agonist- n/a GLP1 instructions: n/a  Blood Thinner Instructions: n/a Aspirin Instructions: Surgeon's office called during PAT appointment to receive instructions. Voicemail left with office to call patient back to receive ASA instructions. Pt instructed to call office tomorrow if he does not hear back from them today to receive instructions.  ERAS Protcol - Clear liquids until 0430 morning of surgery PRE-SURGERY Ensure or G2- G2 given to patient with instructions  COVID TEST- n/a   Anesthesia review: No.  Patient denies shortness of breath, fever, cough and chest pain at PAT appointment. Pt denies any respiratory illness/infection in the last two months.   All instructions explained to the patient, with a verbal understanding of the material. Patient agrees to go over the instructions while at home for a better understanding. Patient also instructed to self quarantine after being tested for COVID-19. The opportunity to ask questions was provided.

## 2023-04-10 NOTE — Progress Notes (Signed)
RN reached out to patient to notify him of ASA instructions per Ophelia Shoulder, PA with Dr. Diamantina Providence office. PA instructed patient to hold ASA five days prior to surgery. RN instructed pt that his last dose of ASA will be Saturday, May 18th and he will not take anymore after that. Pt understood instructions.

## 2023-04-14 ENCOUNTER — Telehealth: Payer: Self-pay | Admitting: Orthopedic Surgery

## 2023-04-14 NOTE — Telephone Encounter (Signed)
Patient came in to drop off pts wife FMLA forms, auths for [employer] and Standard forms,$50 cash. To [employer].

## 2023-04-14 NOTE — Telephone Encounter (Signed)
Called patient and left message on voicemail for patient to stop ASA 81mg  in preparation for upcoming shoulder surgery with Dr. August Saucer on 04-18-23.

## 2023-04-16 ENCOUNTER — Telehealth: Payer: Self-pay | Admitting: Orthopedic Surgery

## 2023-04-16 NOTE — Telephone Encounter (Signed)
Per patient request he would like to go see Hermenia Fiscal, Careers adviser at [facility] on [address] [place] for his PT after surgery Office number [phone] Cell [phone]

## 2023-04-17 ENCOUNTER — Encounter (HOSPITAL_BASED_OUTPATIENT_CLINIC_OR_DEPARTMENT_OTHER): Payer: Self-pay | Admitting: Orthopedic Surgery

## 2023-04-17 ENCOUNTER — Other Ambulatory Visit: Payer: Self-pay

## 2023-04-18 ENCOUNTER — Ambulatory Visit (HOSPITAL_BASED_OUTPATIENT_CLINIC_OR_DEPARTMENT_OTHER): Payer: BC Managed Care – PPO | Admitting: Anesthesiology

## 2023-04-18 ENCOUNTER — Ambulatory Visit (HOSPITAL_BASED_OUTPATIENT_CLINIC_OR_DEPARTMENT_OTHER)
Admission: RE | Admit: 2023-04-18 | Discharge: 2023-04-18 | Disposition: A | Discharge status: Home / Self Care | Payer: BC Managed Care – PPO | Attending: Orthopedic Surgery | Admitting: Orthopedic Surgery

## 2023-04-18 ENCOUNTER — Encounter (HOSPITAL_BASED_OUTPATIENT_CLINIC_OR_DEPARTMENT_OTHER): Payer: Self-pay | Admitting: Orthopedic Surgery

## 2023-04-18 ENCOUNTER — Encounter (HOSPITAL_BASED_OUTPATIENT_CLINIC_OR_DEPARTMENT_OTHER)
Admission: RE | Disposition: A | Discharge status: Home / Self Care | Payer: Self-pay | Source: Home / Self Care | Attending: Orthopedic Surgery

## 2023-04-18 ENCOUNTER — Other Ambulatory Visit: Payer: Self-pay

## 2023-04-18 DIAGNOSIS — S43431A Superior glenoid labrum lesion of right shoulder, initial encounter: Secondary | ICD-10-CM | POA: Diagnosis not present

## 2023-04-18 DIAGNOSIS — E785 Hyperlipidemia, unspecified: Secondary | ICD-10-CM | POA: Insufficient documentation

## 2023-04-18 DIAGNOSIS — M7521 Bicipital tendinitis, right shoulder: Secondary | ICD-10-CM | POA: Diagnosis present

## 2023-04-18 DIAGNOSIS — K219 Gastro-esophageal reflux disease without esophagitis: Secondary | ICD-10-CM | POA: Diagnosis not present

## 2023-04-18 DIAGNOSIS — M7551 Bursitis of right shoulder: Secondary | ICD-10-CM

## 2023-04-18 DIAGNOSIS — E119 Type 2 diabetes mellitus without complications: Secondary | ICD-10-CM | POA: Diagnosis not present

## 2023-04-18 DIAGNOSIS — I10 Essential (primary) hypertension: Secondary | ICD-10-CM | POA: Diagnosis not present

## 2023-04-18 DIAGNOSIS — Z79899 Other long term (current) drug therapy: Secondary | ICD-10-CM | POA: Insufficient documentation

## 2023-04-18 DIAGNOSIS — M1990 Unspecified osteoarthritis, unspecified site: Secondary | ICD-10-CM | POA: Insufficient documentation

## 2023-04-18 DIAGNOSIS — Z7984 Long term (current) use of oral hypoglycemic drugs: Secondary | ICD-10-CM | POA: Insufficient documentation

## 2023-04-18 DIAGNOSIS — Z01818 Encounter for other preprocedural examination: Secondary | ICD-10-CM

## 2023-04-18 HISTORY — PX: SHOULDER ARTHROSCOPY WITH SUBACROMIAL DECOMPRESSION AND BICEP TENDON REPAIR: SHX5689

## 2023-04-18 LAB — GLUCOSE, CAPILLARY
Glucose-Capillary: 123 mg/dL — ABNORMAL HIGH (ref 70–99)
Glucose-Capillary: 141 mg/dL — ABNORMAL HIGH (ref 70–99)

## 2023-04-18 SURGERY — SHOULDER ARTHROSCOPY WITH SUBACROMIAL DECOMPRESSION AND BICEP TENDON REPAIR
Anesthesia: General | Site: Shoulder | Laterality: Right

## 2023-04-18 MED ORDER — PHENYLEPHRINE HCL (PRESSORS) 10 MG/ML IV SOLN
INTRAVENOUS | Status: AC
  Filled 2023-04-18: qty 1

## 2023-04-18 MED ORDER — ACETAMINOPHEN 160 MG/5ML PO SOLN: 325.0000 mg | ORAL | Status: DC | PRN

## 2023-04-18 MED ORDER — ESMOLOL HCL 100 MG/10ML IV SOLN
INTRAVENOUS | Status: AC
  Filled 2023-04-18: qty 10

## 2023-04-18 MED ORDER — LIDOCAINE 2% (20 MG/ML) 5 ML SYRINGE
INTRAMUSCULAR | Status: AC
  Filled 2023-04-18: qty 5

## 2023-04-18 MED ORDER — LABETALOL HCL 5 MG/ML IV SOLN
INTRAVENOUS | Status: AC
  Filled 2023-04-18: qty 4

## 2023-04-18 MED ORDER — GABAPENTIN 300 MG PO CAPS: 300.0000 mg | ORAL_CAPSULE | Freq: Three times a day (TID) | ORAL | 0 refills | Status: AC

## 2023-04-18 MED ORDER — FENTANYL CITRATE (PF) 100 MCG/2ML IJ SOLN: 25.0000 ug | INTRAMUSCULAR | Status: DC | PRN

## 2023-04-18 MED ORDER — PROPOFOL 10 MG/ML IV BOLUS
INTRAVENOUS | Status: AC
  Filled 2023-04-18: qty 20

## 2023-04-18 MED ORDER — METHOCARBAMOL 500 MG PO TABS: 500.0000 mg | ORAL_TABLET | Freq: Three times a day (TID) | ORAL | 1 refills | Status: AC | PRN

## 2023-04-18 MED ORDER — ROCURONIUM BROMIDE 100 MG/10ML IV SOLN
INTRAVENOUS | Status: DC | PRN
  Administered 2023-04-18: 60 mg via INTRAVENOUS

## 2023-04-18 MED ORDER — SUGAMMADEX SODIUM 200 MG/2ML IV SOLN
INTRAVENOUS | Status: DC | PRN
  Administered 2023-04-18: 200 mg via INTRAVENOUS

## 2023-04-18 MED ORDER — FENTANYL CITRATE (PF) 100 MCG/2ML IJ SOLN
50.0000 ug | Freq: Once | INTRAMUSCULAR | Status: AC
  Administered 2023-04-18: 50 ug via INTRAVENOUS

## 2023-04-18 MED ORDER — LIDOCAINE 2% (20 MG/ML) 5 ML SYRINGE
INTRAMUSCULAR | Status: DC | PRN
  Administered 2023-04-18: 50 mg via INTRAVENOUS

## 2023-04-18 MED ORDER — HYDROMORPHONE HCL 2 MG PO TABS: 2.0000 mg | ORAL_TABLET | Freq: Four times a day (QID) | ORAL | 0 refills | Status: AC | PRN

## 2023-04-18 MED ORDER — AMISULPRIDE (ANTIEMETIC) 5 MG/2ML IV SOLN: 10.0000 mg | Freq: Once | INTRAVENOUS | Status: DC | PRN

## 2023-04-18 MED ORDER — FENTANYL CITRATE (PF) 100 MCG/2ML IJ SOLN
INTRAMUSCULAR | Status: AC
  Filled 2023-04-18: qty 2

## 2023-04-18 MED ORDER — LABETALOL HCL 5 MG/ML IV SOLN
5.0000 mg | Freq: Once | INTRAVENOUS | Status: AC
  Administered 2023-04-18: 5 mg via INTRAVENOUS

## 2023-04-18 MED ORDER — ONDANSETRON HCL 4 MG/2ML IJ SOLN
INTRAMUSCULAR | Status: DC | PRN
  Administered 2023-04-18: 4 mg via INTRAVENOUS

## 2023-04-18 MED ORDER — OXYCODONE HCL 5 MG PO TABS: 5.0000 mg | ORAL_TABLET | Freq: Once | ORAL | Status: DC | PRN

## 2023-04-18 MED ORDER — BUPIVACAINE LIPOSOME 1.3 % IJ SUSP
INTRAMUSCULAR | Status: DC | PRN
  Administered 2023-04-18: 10 mL via PERINEURAL

## 2023-04-18 MED ORDER — TRANEXAMIC ACID-NACL 1000-0.7 MG/100ML-% IV SOLN
1000.0000 mg | INTRAVENOUS | Status: AC
  Administered 2023-04-18: 1000 mg via INTRAVENOUS

## 2023-04-18 MED ORDER — CEFAZOLIN SODIUM-DEXTROSE 2-4 GM/100ML-% IV SOLN
2.0000 g | INTRAVENOUS | Status: AC
  Administered 2023-04-18: 2 g via INTRAVENOUS

## 2023-04-18 MED ORDER — SODIUM CHLORIDE 0.9 % IR SOLN
Status: DC | PRN
  Administered 2023-04-18: 6000 mL

## 2023-04-18 MED ORDER — ESMOLOL HCL 100 MG/10ML IV SOLN
INTRAVENOUS | Status: DC | PRN
  Administered 2023-04-18 (×2): 20 mg via INTRAVENOUS

## 2023-04-18 MED ORDER — MIDAZOLAM HCL 2 MG/2ML IJ SOLN
INTRAMUSCULAR | Status: AC
  Filled 2023-04-18: qty 2

## 2023-04-18 MED ORDER — ACETAMINOPHEN 325 MG PO TABS: 325.0000 mg | ORAL_TABLET | ORAL | Status: DC | PRN

## 2023-04-18 MED ORDER — PROMETHAZINE HCL 25 MG/ML IJ SOLN: 6.2500 mg | INTRAMUSCULAR | Status: DC | PRN

## 2023-04-18 MED ORDER — CEFAZOLIN SODIUM-DEXTROSE 2-4 GM/100ML-% IV SOLN
INTRAVENOUS | Status: AC
  Filled 2023-04-18: qty 100

## 2023-04-18 MED ORDER — ACETAMINOPHEN 10 MG/ML IV SOLN: 1000.0000 mg | Freq: Once | INTRAVENOUS | Status: DC | PRN

## 2023-04-18 MED ORDER — EPINEPHRINE PF 1 MG/ML IJ SOLN
INTRAMUSCULAR | Status: AC
  Filled 2023-04-18: qty 1

## 2023-04-18 MED ORDER — DEXAMETHASONE SODIUM PHOSPHATE 10 MG/ML IJ SOLN
INTRAMUSCULAR | Status: DC | PRN
  Administered 2023-04-18: 5 mg via INTRAVENOUS

## 2023-04-18 MED ORDER — PROPOFOL 10 MG/ML IV BOLUS
INTRAVENOUS | Status: DC | PRN
  Administered 2023-04-18: 150 mg via INTRAVENOUS

## 2023-04-18 MED ORDER — PHENYLEPHRINE HCL-NACL 20-0.9 MG/250ML-% IV SOLN
INTRAVENOUS | Status: DC | PRN
  Administered 2023-04-18: 25 ug/min via INTRAVENOUS

## 2023-04-18 MED ORDER — SODIUM CHLORIDE 0.9 % IR SOLN
Status: DC | PRN
  Administered 2023-04-18: 1000 mL

## 2023-04-18 MED ORDER — OXYCODONE HCL 5 MG/5ML PO SOLN: 5.0000 mg | Freq: Once | ORAL | Status: DC | PRN

## 2023-04-18 MED ORDER — DEXAMETHASONE SODIUM PHOSPHATE 10 MG/ML IJ SOLN
INTRAMUSCULAR | Status: AC
  Filled 2023-04-18: qty 1

## 2023-04-18 MED ORDER — FENTANYL CITRATE (PF) 100 MCG/2ML IJ SOLN
INTRAMUSCULAR | Status: DC | PRN
  Administered 2023-04-18 (×2): 50 ug via INTRAVENOUS

## 2023-04-18 MED ORDER — ONDANSETRON HCL 4 MG/2ML IJ SOLN
INTRAMUSCULAR | Status: AC
  Filled 2023-04-18: qty 2

## 2023-04-18 MED ORDER — BUPIVACAINE HCL (PF) 0.5 % IJ SOLN
INTRAMUSCULAR | Status: DC | PRN
  Administered 2023-04-18: 12 mL via PERINEURAL

## 2023-04-18 MED ORDER — ROCURONIUM BROMIDE 10 MG/ML (PF) SYRINGE
PREFILLED_SYRINGE | INTRAVENOUS | Status: AC
  Filled 2023-04-18: qty 10

## 2023-04-18 MED ORDER — LACTATED RINGERS IV SOLN: INTRAVENOUS | Status: DC

## 2023-04-18 MED ORDER — TRANEXAMIC ACID-NACL 1000-0.7 MG/100ML-% IV SOLN
INTRAVENOUS | Status: AC
  Filled 2023-04-18: qty 100

## 2023-04-18 MED ORDER — MIDAZOLAM HCL 2 MG/2ML IJ SOLN
2.0000 mg | Freq: Once | INTRAMUSCULAR | Status: AC
  Administered 2023-04-18: 2 mg via INTRAVENOUS

## 2023-04-18 SURGICAL SUPPLY — 84 items
AID PSTN UNV HD RSTRNT DISP (MISCELLANEOUS) ×1
ANCH SUT 2 FBRTK KNTLS 1.8 (Anchor) ×1 IMPLANT
ANCHOR SUT 1.8 FIBERTAK SB KL (Anchor) IMPLANT
BLADE EXCALIBUR 4.0X13 (MISCELLANEOUS) IMPLANT
BLADE SHAVER BONE 5.0X13 (MISCELLANEOUS) IMPLANT
BLADE SURG 10 STRL SS (BLADE) IMPLANT
BLADE SURG 15 STRL LF DISP TIS (BLADE) IMPLANT
BLADE SURG 15 STRL SS (BLADE) ×1
BURR OVAL 8 FLU 4.0X13 (MISCELLANEOUS) IMPLANT
BURR OVAL 8 FLU 5.0X13 (MISCELLANEOUS) IMPLANT
CANNULA 5.75X71 LONG (CANNULA) IMPLANT
CANNULA TWIST IN 8.25X7CM (CANNULA) IMPLANT
COOLER ICEMAN CLASSIC (MISCELLANEOUS) ×1 IMPLANT
DISSECTOR  3.8MM X 13CM (MISCELLANEOUS)
DISSECTOR 3.8MM X 13CM (MISCELLANEOUS) IMPLANT
DISSECTOR 4.0MM X 13CM (MISCELLANEOUS) ×1 IMPLANT
DRAPE IMP U-DRAPE 54X76 (DRAPES) IMPLANT
DRAPE INCISE IOBAN 66X45 STRL (DRAPES) ×2 IMPLANT
DRAPE STERI 35X30 U-POUCH (DRAPES) ×1 IMPLANT
DRAPE U-SHAPE 47X51 STRL (DRAPES) ×2 IMPLANT
DRAPE U-SHAPE 76X120 STRL (DRAPES) ×2 IMPLANT
DRSG AQUACEL AG ADV 3.5X 6 (GAUZE/BANDAGES/DRESSINGS) IMPLANT
DRSG TEGADERM 4X4.75 (GAUZE/BANDAGES/DRESSINGS) IMPLANT
DURAPREP 26ML APPLICATOR (WOUND CARE) ×1 IMPLANT
DW OUTFLOW CASSETTE/TUBE SET (MISCELLANEOUS) ×1 IMPLANT
ELECT REM PT RETURN 9FT ADLT (ELECTROSURGICAL) ×1
ELECTRODE REM PT RTRN 9FT ADLT (ELECTROSURGICAL) ×1 IMPLANT
EXCALIBUR 3.8MM X 13CM (MISCELLANEOUS) IMPLANT
GAUZE SPONGE 4X4 12PLY STRL (GAUZE/BANDAGES/DRESSINGS) ×1 IMPLANT
GAUZE XEROFORM 1X8 LF (GAUZE/BANDAGES/DRESSINGS) ×1 IMPLANT
GLOVE BIO SURGEON STRL SZ7 (GLOVE) ×1 IMPLANT
GLOVE BIO SURGEON STRL SZ8 (GLOVE) ×1 IMPLANT
GLOVE BIOGEL PI IND STRL 7.0 (GLOVE) ×1 IMPLANT
GLOVE BIOGEL PI IND STRL 8 (GLOVE) ×1 IMPLANT
GOWN STRL REUS W/ TWL LRG LVL3 (GOWN DISPOSABLE) ×2 IMPLANT
GOWN STRL REUS W/ TWL XL LVL3 (GOWN DISPOSABLE) ×1 IMPLANT
GOWN STRL REUS W/TWL LRG LVL3 (GOWN DISPOSABLE) ×2
GOWN STRL REUS W/TWL XL LVL3 (GOWN DISPOSABLE) ×1
KIT STR SPEAR 1.8 FBRTK DISP (KITS) IMPLANT
MANIFOLD NEPTUNE II (INSTRUMENTS) ×1 IMPLANT
NDL HD SCORPION MEGA LOADER (NEEDLE) IMPLANT
NDL SAFETY ECLIP 18X1.5 (MISCELLANEOUS) ×1 IMPLANT
NDL SUT 6 .5 CRC .975X.05 MAYO (NEEDLE) IMPLANT
NEEDLE MAYO TAPER (NEEDLE)
NS IRRIG 1000ML POUR BTL (IV SOLUTION) IMPLANT
PACK ARTHROSCOPY DSU (CUSTOM PROCEDURE TRAY) ×1 IMPLANT
PACK BASIN DAY SURGERY FS (CUSTOM PROCEDURE TRAY) ×1 IMPLANT
PAD COLD SHLDR WRAP-ON (PAD) ×1 IMPLANT
PENCIL SMOKE EVACUATOR (MISCELLANEOUS) ×1 IMPLANT
PORT APPOLLO RF 90DEGREE MULTI (SURGICAL WAND) ×1 IMPLANT
RESTRAINT HEAD UNIVERSAL NS (MISCELLANEOUS) ×1 IMPLANT
SLEEVE SCD COMPRESS KNEE MED (STOCKING) ×1 IMPLANT
SLING ARM FOAM STRAP LRG (SOFTGOODS) IMPLANT
SLING ARM IMMOBILIZER LRG (SOFTGOODS) IMPLANT
SPIKE FLUID TRANSFER (MISCELLANEOUS) IMPLANT
SPONGE SURGIFOAM ABS GEL 12-7 (HEMOSTASIS) IMPLANT
SPONGE T-LAP 4X18 ~~LOC~~+RFID (SPONGE) IMPLANT
STRIP CLOSURE SKIN 1/2X4 (GAUZE/BANDAGES/DRESSINGS) IMPLANT
SUCTION FRAZIER HANDLE 10FR (MISCELLANEOUS) ×1
SUCTION TUBE FRAZIER 10FR DISP (MISCELLANEOUS) ×1 IMPLANT
SUT BONE WAX W31G (SUTURE) IMPLANT
SUT ETHIBOND 2 OS 4 DA (SUTURE) IMPLANT
SUT ETHILON 3 0 PS 1 (SUTURE) ×1 IMPLANT
SUT FIBERWIRE #2 38 T-5 BLUE (SUTURE)
SUT FIBERWIRE 2-0 18 17.9 3/8 (SUTURE)
SUT MNCRL AB 3-0 PS2 18 (SUTURE) ×1 IMPLANT
SUT PDS AB 0 CT 36 (SUTURE) IMPLANT
SUT TICRON 1 T 12 (SUTURE) IMPLANT
SUT VIC AB 1 CT1 27 (SUTURE) ×2
SUT VIC AB 1 CT1 27XBRD ANBCTR (SUTURE) IMPLANT
SUT VIC AB 2-0 SH 27 (SUTURE) ×1
SUT VIC AB 2-0 SH 27XBRD (SUTURE) IMPLANT
SUT VIC AB 4-0 PS2 18 (SUTURE) ×1 IMPLANT
SUT VICRYL 0 UR6 27IN ABS (SUTURE) IMPLANT
SUTURE FIBERWR #2 38 T-5 BLUE (SUTURE) IMPLANT
SUTURE FIBERWR 2-0 18 17.9 3/8 (SUTURE) IMPLANT
SYR 5ML LL (SYRINGE) IMPLANT
SYR BULB IRRIG 60ML STRL (SYRINGE) IMPLANT
SYS FBRTK BUTTON 2.6 (Anchor) ×1 IMPLANT
SYSTEM FBRTK BUTTON 2.6 (Anchor) IMPLANT
TOWEL GREEN STERILE FF (TOWEL DISPOSABLE) ×1 IMPLANT
TUBE CONNECTING 20X1/4 (TUBING) ×1 IMPLANT
TUBING ARTHROSCOPY IRRIG 16FT (MISCELLANEOUS) ×1 IMPLANT
YANKAUER SUCT BULB TIP NO VENT (SUCTIONS) IMPLANT

## 2023-04-18 NOTE — Progress Notes (Signed)
Assisted Dr. Hollis with right, supraclavicular, ultrasound guided block. Side rails up, monitors on throughout procedure. See vital signs in flow sheet. Tolerated Procedure well. 

## 2023-04-18 NOTE — Anesthesia Procedure Notes (Signed)
Procedure Name: Intubation Date/Time: 04/18/2023 7:41 AM  Performed by: Lauralyn Primes, CRNAPre-anesthesia Checklist: Patient identified, Emergency Drugs available, Suction available and Patient being monitored Patient Re-evaluated:Patient Re-evaluated prior to induction Oxygen Delivery Method: Circle system utilized Preoxygenation: Pre-oxygenation with 100% oxygen Induction Type: IV induction Ventilation: Mask ventilation without difficulty and Oral airway inserted - appropriate to patient size Laryngoscope Size: Mac and 4 Grade View: Grade I Tube type: Oral Tube size: 7.5 mm Number of attempts: 1 Airway Equipment and Method: Stylet, Oral airway and Bite block Placement Confirmation: ETT inserted through vocal cords under direct vision, positive ETCO2 and breath sounds checked- equal and bilateral Secured at: 22 cm Tube secured with: Tape Dental Injury: Teeth and Oropharynx as per pre-operative assessment

## 2023-04-18 NOTE — Op Note (Unsigned)
NAMEORIE, WORSTELL MEDICAL RECORD NO: [id number] ACCOUNT NO: [id number] DATE OF BIRTH: 01-12-1951 FACILITY: [facility] LOCATION: [facility] PHYSICIAN: Graylin Shiver. August Saucer, MD  Operative Report   DATE OF PROCEDURE: 04/18/2023  PREOPERATIVE DIAGNOSIS:  Right shoulder biceps tendinitis and bursitis.  POSTOPERATIVE DIAGNOSIS:  Right shoulder biceps tendinitis and bursitis.  PROCEDURE:  Right shoulder arthroscopy with limited debridement of the superior labrum, debridement of early synovitis within the joint, biceps tendon release, subacromial decompression, mini open biceps tenodesis using Arthrex suture anchors.  SURGEON:  Graylin Shiver. August Saucer, MD  ASSISTANT:  Karenann Cai, PA  INDICATIONS:  The patient is a 72 year old patient with right shoulder pain refractory to nonoperative management, who presents for operative management after explanation of risks and benefits.  MRI scan shows biceps tendinitis with partial tearing.  PROCEDURE DETAILS:  The patient was brought to the operating room where general anesthetic was induced.  Preoperative antibiotics administered.  Timeout was called.  The patient's right arm was examined under anesthesia and found to have passive motion  of 65/90/150.  The patient had good stability.  The patient's right arm, shoulder and hand prescrubbed with alcohol and Betadine, allowed to air dry, prepped with DuraPrep solution and draped in sterile manner.  Ioban used to seal the operative field and  cover the axilla.  The posterior portal was created 2 cm medial and inferior to the posterolateral margin of the acromion. Anterior portal was created under direct visualization.  Diagnostic arthroscopy was performed.  The patient did have significant  tearing of the biceps tendon at about 0.1 cm distal to its anchor.  Glenohumeral articular surfaces were intact.  Rotator cuff was intact on the articular side.  Anterior inferior and posterior inferior glenohumeral ligaments intact.   There was some  synovitis within the superior capsular region.  Also, there was some synovitis within the rotator interval.  The biceps tendon was released and the superior labrum was debrided.  Following this, the shoulder joint was irrigated.  Instruments were removed  and those portals were closed using 3-0 nylon suture.  Ioban then used to cover the entire operative field.  About a 3 cm incision made off the anterolateral margin of the acromion. Skin and subcutaneous tissue sharply divided.  Deltoid was split a  measured distance of 4 cm from the anterolateral margin of the acromion. Deltoid was then split. Bursectomy performed.  Rotator cuff intact from the bursal side.  There was some mild thinning, but no full thickness tear.  Subacromial decompression  performed with a rasp.  The biceps tendon was then tenodesed into the bicipital groove after opening up the transverse humeral ligament.  Two knotless suture anchors were placed in the proximal and distal aspect of the bicipital groove and a grasping  FiberLoop suture was placed onto the biceps tendon.  Biceps tendon was then tacked down under appropriate tension and oversewn with two 0 Vicryl sutures.  Thorough irrigation was performed.  Self-retaining retractor was removed and the deltoid split was  closed using #1 Vicryl suture followed by interrupted inverted 0 Vicryl suture, 2-0 Vicryl suture, and 3-0 Monocryl with Steri-Strips.  Impervious dressings applied.  Shoulder sling applied.  Luke's assistance was required at all times for retraction,  opening, closing, mobilization of tissue.  His assistance was a medical necessity.   VAI D: 04/18/2023 9:08:33 am T: 04/18/2023 9:17:00 am  JOB: [id number]/ [id number]

## 2023-04-18 NOTE — H&P (Signed)
James [name]. is an 72 y.o. male.   Chief Complaint: right shoulder pain HPI:  James [name]. is a 72 y.o. male who presents  reporting right shoulder pain.  Has had pain for 6 months.  Denies a history of injury.  Has a history of being in the [employer].  Reports pain with decreased range of motion.  States that it aches all the time.  He is right-hand dominant.  Denies much in the way of mechanical symptoms.  Had a cortisone injection under ultrasound guidance which did not give him too much help.  That was done 2 months ago.  Has well-controlled diabetes as well as hypertension.  He has had a nonarthrogram MRI scan at [facility] which shows high-grade partial tear of the long head of the biceps tendon with moderate tenosynovitis along with anterior supraspinatus partial tearing.  Moderate AC joint degenerative changes with mild bursitis.  Mild adhesive capsulitis   Past Medical History:  Diagnosis Date   Arthritis    Diabetes mellitus    GERD (gastroesophageal reflux disease)    HLD (hyperlipidemia)    Hydrocele    Hypertension     Past Surgical History:  Procedure Laterality Date   APPENDECTOMY     As a teenager   COLONOSCOPY  2023   HEMORRHOID SURGERY     LASIK Bilateral    TONSILLECTOMY     As a child    Family History  Problem Relation Age of Onset   Brain cancer Brother        deceased age 58   Lung cancer Brother        deceased age 2   Diabetes Mother    Heart disease Mother        CHF, deceased 15   Diabetes Father    Kidney failure Father        deceased 50   Colon cancer Neg Hx    Social History:  reports that he has never smoked. He has never used smokeless tobacco. He reports that he does not drink alcohol and does not use drugs.  Allergies:  Allergies  Allergen Reactions   Hydrocodone Hives, Itching and Swelling   Amoxicillin Rash   Penicillins Rash and Other (See Comments)    Has patient had a PCN reaction causing immediate rash,  facial/tongue/throat swelling, SOB or lightheadedness with hypotension: yes Has patient had a PCN reaction causing severe rash involving mucus membranes or skin necrosis: No Has patient had a PCN reaction that required hospitalization yes Has patient had a PCN reaction occurring within the last 10 years: No If all of the above answers are "NO", then may proceed with Cephalosporin use. Caused him to pass out     Percocet [Oxycodone-Acetaminophen] Itching and Rash    Medications Prior to Admission  Medication Sig Dispense Refill   acetaminophen (TYLENOL) 500 MG tablet Take 1 tablet (500 mg total) by mouth every 6 (six) hours as needed for mild pain. 30 tablet 0   aspirin EC 81 MG tablet Take 81 mg by mouth daily.      atorvastatin (LIPITOR) 80 MG tablet Take 0.5 tablets by mouth at bedtime.     folic acid (FOLVITE) 1 MG tablet Take 1 tablet by mouth once daily 30 tablet 0   glipiZIDE (GLUCOTROL XL) 10 MG 24 hr tablet Take 10 mg by mouth daily.      hydrochlorothiazide (HYDRODIURIL) 25 MG tablet Take 25 mg by mouth daily.  1  latanoprost (XALATAN) 0.005 % ophthalmic solution Place 1 drop into both eyes at bedtime.     metFORMIN (GLUCOPHAGE-XR) 750 MG 24 hr tablet Take 1,500 mg by mouth at bedtime.      Multiple Vitamin (MULTIVITAMIN WITH MINERALS) TABS Take 1 tablet by mouth daily. Men's One a Day     simvastatin (ZOCOR) 20 MG tablet Take 20 mg by mouth daily.  1   telmisartan (MICARDIS) 80 MG tablet Take 80 mg by mouth daily.      vitamin C (ASCORBIC ACID) 500 MG tablet Take 500 mg by mouth daily.     benzonatate (TESSALON) 100 MG capsule Take 1 capsule (100 mg total) by mouth every 8 (eight) hours. (Patient not taking: Reported on 04/10/2023) 21 capsule 0   loperamide (IMODIUM) 2 MG capsule Take 1 capsule (2 mg total) by mouth 4 (four) times daily as needed for diarrhea or loose stools. (Patient not taking: Reported on 04/10/2023) 12 capsule 0   methylcellulose (CITRUCEL) oral powder Take 1  packet by mouth daily. (Patient not taking: Reported on 04/10/2023) 454 g 0   omeprazole (PRILOSEC) 20 MG capsule Take 1 capsule (20 mg total) by mouth daily. (Patient not taking: Reported on 04/13/2018) 30 capsule 0   ondansetron (ZOFRAN ODT) 8 MG disintegrating tablet Take 1 tablet (8 mg total) by mouth every 8 (eight) hours as needed for nausea or vomiting. (Patient not taking: Reported on 04/10/2023) 20 tablet 0   tiZANidine (ZANAFLEX) 2 MG tablet Take 1 tablet (2 mg total) by mouth every 8 (eight) hours as needed for muscle spasms. (Patient not taking: Reported on 04/10/2023) 15 tablet 0    Results for orders placed or performed during the hospital encounter of 04/18/23 (from the past 48 hour(s))  Glucose, capillary     Status: Abnormal   Collection Time: 04/18/23  6:47 AM  Result Value Ref Range   Glucose-Capillary 123 (H) 70 - 99 mg/dL    Comment: Glucose reference range applies only to samples taken after fasting for at least 8 hours.   Comment 1 Notify RN    Comment 2 Document in Chart    No results found.  Review of Systems  Musculoskeletal:  Positive for arthralgias.  All other systems reviewed and are negative.   Blood pressure (!) 149/87, pulse 77, temperature 98.3 F (36.8 C), temperature source Oral, resp. rate 19, height 5\' 5"  (1.651 m), weight 78 kg, SpO2 98 %. Physical Exam Vitals reviewed.  HENT:     Head: Normocephalic.     Nose: Nose normal.     Mouth/Throat:     Mouth: Mucous membranes are moist.  Eyes:     Pupils: Pupils are equal, round, and reactive to light.  Cardiovascular:     Rate and Rhythm: Normal rate.     Pulses: Normal pulses.  Pulmonary:     Effort: Pulmonary effort is normal.  Abdominal:     General: Abdomen is flat.  Musculoskeletal:     Cervical back: Normal range of motion.  Skin:    General: Skin is warm.     Capillary Refill: Capillary refill takes less than 2 seconds.  Neurological:     General: No focal deficit present.     Mental  Status: He is alert.  Psychiatric:        Mood and Affect: Mood normal.      Ortho exam demonstrates good cervical spine range of motion.  5 out of 5 grip EPL FPL interosseous resection  extension bicep triceps and deltoid strength.  No discrete AC joint tenderness right versus left.  Has symmetric range of motion approximately 50/90/160.  Rotator cuff strength is excellent infraspinatus supraspinatus and subscap muscle testing with no coarse grinding or crepitus with internal/external rotation of that right arm at 90 degrees of abduction.  O'Brien's testing is positive.  Pain does radiate to the biceps on the right.   Assessment/Plan Impression is symptomatic biceps tendon partial tearing. Does not really look too much like frozen shoulder at this time with fairly symmetric passive range of motion. He has had a long history of symptoms. Talk to him about operative and nonoperative treatment options. He already has had an injection which did not help him very much. I think his best course of treatment would be arthroscopy with subacromial decompression and biceps tendon release with biceps tenodesis. We can also examine that rotator cuff at the time of surgery but does not really look torn based on the report as well as physical exam. The risk benefits of surgery are discussed with the patient include not limited to infection or vessel damage shoulder stiffness as well as incomplete restoration of function and a relatively long rehab time required to get back to a functional level for physical activity. Patient understands risk benefits and wishes to proceed. All questions answered   Burnard Bunting, MD 04/18/2023, 6:56 AM

## 2023-04-18 NOTE — Anesthesia Procedure Notes (Signed)
Anesthesia Regional Block: Interscalene brachial plexus block   Pre-Anesthetic Checklist: , timeout performed,  Correct Patient, Correct Site, Correct Laterality,  Correct Procedure, Correct Position, site marked,  Risks and benefits discussed,  Surgical consent,  Pre-op evaluation,  At surgeon's request and post-op pain management  Laterality: Right  Prep: chloraprep       Needles:  Injection technique: Single-shot  Needle Type: Echogenic Stimulator Needle     Needle Length: 9cm  Needle Gauge: 21     Additional Needles:   Procedures:,,,, ultrasound used (permanent image in chart),,    Narrative:  Start time: 04/18/2023 7:10 AM End time: 04/18/2023 7:15 AM Injection made incrementally with aspirations every 5 mL.  Performed by: Personally  Anesthesiologist: Shelton Silvas, MD  Additional Notes: Discussed risks and benefits of the nerve block in detail, including but not limited vascular injury, permanent nerve damage and infection.   Patient tolerated the procedure well. Local anesthetic introduced in an incremental fashion under minimal resistance after negative aspirations. No paresthesias were elicited. After completion of the procedure, no acute issues were identified and patient continued to be monitored by RN.

## 2023-04-18 NOTE — Anesthesia Postprocedure Evaluation (Signed)
Anesthesia Post Note  Patient: James Stanford Sr.  Procedure(s) Performed: RIGHT SHOULDER ARTHROSCOPY, DEBRIDEMENT, SUBACROMIAL DECOMPRESSION, BICEPS TENODESIS (Right: Shoulder)     Patient location during evaluation: PACU Anesthesia Type: General Level of consciousness: awake and alert Pain management: pain level controlled Vital Signs Assessment: post-procedure vital signs reviewed and stable Respiratory status: spontaneous breathing, nonlabored ventilation, respiratory function stable and patient connected to nasal cannula oxygen Cardiovascular status: blood pressure returned to baseline and stable Postop Assessment: no apparent nausea or vomiting Anesthetic complications: no  No notable events documented.  Last Vitals:  Vitals:   04/18/23 1024 04/18/23 1033  BP: (!) 154/94 (!) 155/90  Pulse: 85 91  Resp: 15 16  Temp:  36.4 C  SpO2: 92% 94%    Last Pain:  Vitals:   04/18/23 1033  TempSrc:   PainSc: 0-No pain                 Shelton Silvas

## 2023-04-18 NOTE — Telephone Encounter (Signed)
Holding for [employer].   Patient had surgery 04/18/2023.

## 2023-04-18 NOTE — Brief Op Note (Signed)
   04/18/2023  9:03 AM  PATIENT:  James Stanford Sr.  73 y.o. male  PRE-OPERATIVE DIAGNOSIS:  right shoulder bursitis, biceps tendonitis  POST-OPERATIVE DIAGNOSIS:  right shoulder bursitis, biceps tendonitis  PROCEDURE:  Procedure(s): RIGHT SHOULDER ARTHROSCOPY, DEBRIDEMENT, SUBACROMIAL DECOMPRESSION, BICEPS TENODESIS  SURGEON:  Surgeon(s): August Saucer, Corrie Mckusick, MD  ASSISTANT: magnant pa  ANESTHESIA:   general  EBL: 5 ml    Total I/O In: 100 [IV Piggyback:100] Out: -   BLOOD ADMINISTERED: none  DRAINS: none   LOCAL MEDICATIONS USED:  none  SPECIMEN:  No Specimen  COUNTS:  YES  TOURNIQUET:  * No tourniquets in log *  DICTATION: .Other Dictation: Dictation Number done no number given   PLAN OF CARE: Discharge to home after PACU  PATIENT DISPOSITION:  PACU - hemodynamically stable

## 2023-04-18 NOTE — Transfer of Care (Signed)
Immediate Anesthesia Transfer of Care Note  Patient: James Stanford Sr.  Procedure(s) Performed: RIGHT SHOULDER ARTHROSCOPY, DEBRIDEMENT, SUBACROMIAL DECOMPRESSION, BICEPS TENODESIS (Right: Shoulder)  Patient Location: PACU  Anesthesia Type:GA combined with regional for post-op pain  Level of Consciousness: drowsy  Airway & Oxygen Therapy: Patient Spontanous Breathing and Patient connected to face mask oxygen  Post-op Assessment: Report given to RN and Post -op Vital signs reviewed and stable  Post vital signs: Reviewed and stable  Last Vitals:  Vitals Value Taken Time  BP 148/93 04/18/23 0933  Temp    Pulse 94 04/18/23 0934  Resp 14 04/18/23 0934  SpO2 97 % 04/18/23 0934  Vitals shown include unvalidated device data.  Last Pain:  Vitals:   04/18/23 0928  TempSrc:   PainSc: Asleep      Patients Stated Pain Goal: 5 (04/18/23 4696)  Complications: No notable events documented.

## 2023-04-18 NOTE — Discharge Instructions (Signed)
°Information for Discharge Teaching: °EXPAREL (bupivacaine liposome injectable suspension)  ° °Your surgeon or anesthesiologist gave you EXPAREL(bupivacaine) to help control your pain after surgery.  °· EXPAREL is a local anesthetic that provides pain relief by numbing the tissue around the surgical site. °· EXPAREL is designed to release pain medication over time and can control pain for up to 72 hours. °· Depending on how you respond to EXPAREL, you may require less pain medication during your recovery. ° °Possible side effects: °· Temporary loss of sensation or ability to move in the area where bupivacaine was injected. °· Nausea, vomiting, constipation °· Rarely, numbness and tingling in your mouth or lips, lightheadedness, or anxiety may occur. °· Call your doctor right away if you think you may be experiencing any of these sensations, or if you have other questions regarding possible side effects. ° °Follow all other discharge instructions given to you by your surgeon or nurse. Eat a healthy diet and drink plenty of water or other fluids. ° °If you return to the hospital for any reason within 96 hours following the administration of EXPAREL, it is important for health care providers to know that you have received this anesthetic. A teal colored band has been placed on your arm with the date, time and amount of EXPAREL you have received in order to alert and inform your health care providers. Please leave this armband in place for the full 96 hours following administration, and then you may remove the band. ° ° ° °Regional Anesthesia Blocks ° °1. Numbness or the inability to move the "blocked" extremity may last from 3-48 hours after placement. The length of time depends on the medication injected and your individual response to the medication. If the numbness is not going away after 48 hours, call your surgeon. ° °2. The extremity that is blocked will need to be protected until the numbness is gone and the   Strength has returned. Because you cannot feel it, you will need to take extra care to avoid injury. Because it may be weak, you may have difficulty moving it or using it. You may not know what position it is in without looking at it while the block is in effect. ° °3. For blocks in the legs and feet, returning to weight bearing and walking needs to be done carefully. You will need to wait until the numbness is entirely gone and the strength has returned. You should be able to move your leg and foot normally before you try and bear weight or walk. You will need someone to be with you when you first try to ensure you do not fall and possibly risk injury. ° °4. Bruising and tenderness at the needle site are common side effects and will resolve in a few days. ° °5. Persistent numbness or new problems with movement should be communicated to the surgeon or the [facility] ([phone])/ [facility] ([phone]). ° ° ° ° °Post Anesthesia Home Care Instructions ° °Activity: °Get plenty of rest for the remainder of the day. A responsible individual must stay with you for 24 hours following the procedure.  °For the next 24 hours, DO NOT: °-Drive a car °-Operate machinery °-Drink alcoholic beverages °-Take any medication unless instructed by your physician °-Make any legal decisions or sign important papers. ° °Meals: °Start with liquid foods such as gelatin or soup. Progress to regular foods as tolerated. Avoid greasy, spicy, heavy foods. If nausea and/or vomiting occur, drink only   clear liquids until the nausea and/or vomiting subsides. Call your physician if vomiting continues. ° °Special Instructions/Symptoms: °Your throat may feel dry or sore from the anesthesia or the breathing tube placed in your throat during surgery. If this causes discomfort, gargle with warm salt water. The discomfort should disappear within 24 hours. ° °If you had a scopolamine patch placed behind your ear for the  management of post- operative nausea and/or vomiting: ° °1. The medication in the patch is effective for 72 hours, after which it should be removed.  Wrap patch in a tissue and discard in the trash. Wash hands thoroughly with soap and water. °2. You may remove the patch earlier than 72 hours if you experience unpleasant side effects which may include dry mouth, dizziness or visual disturbances. °3. Avoid touching the patch. Wash your hands with soap and water after contact with the patch. °   ° °

## 2023-04-18 NOTE — Anesthesia Preprocedure Evaluation (Addendum)
Anesthesia Evaluation  Patient identified by MRN, date of birth, ID band Patient awake    Reviewed: Allergy & Precautions, NPO status , Patient's Chart, lab work & pertinent test results  Airway Mallampati: II  TM Distance: >3 FB Neck ROM: Full    Dental  (+) Dental Advisory Given, Edentulous Upper   Pulmonary neg pulmonary ROS   breath sounds clear to auscultation       Cardiovascular hypertension, Pt. on medications  Rhythm:Regular Rate:Normal     Neuro/Psych negative neurological ROS  negative psych ROS   GI/Hepatic Neg liver ROS,GERD  Medicated,,  Endo/Other  diabetes, Type 2, Oral Hypoglycemic Agents    Renal/GU negative Renal ROS     Musculoskeletal  (+) Arthritis ,    Abdominal   Peds  Hematology negative hematology ROS (+)   Anesthesia Other Findings   Reproductive/Obstetrics                             Anesthesia Physical Anesthesia Plan  ASA: 2  Anesthesia Plan: General   Post-op Pain Management: Regional block*   Induction: Intravenous  PONV Risk Score and Plan: 3 and Ondansetron, Midazolam and Treatment may vary due to age or medical condition  Airway Management Planned: Oral ETT  Additional Equipment: None  Intra-op Plan:   Post-operative Plan: Extubation in OR  Informed Consent: I have reviewed the patients History and Physical, chart, labs and discussed the procedure including the risks, benefits and alternatives for the proposed anesthesia with the patient or authorized representative who has indicated his/her understanding and acceptance.     Dental advisory given  Plan Discussed with: CRNA  Anesthesia Plan Comments:        Anesthesia Quick Evaluation

## 2023-04-22 ENCOUNTER — Encounter (HOSPITAL_BASED_OUTPATIENT_CLINIC_OR_DEPARTMENT_OTHER): Payer: Self-pay | Admitting: Orthopedic Surgery

## 2023-04-22 NOTE — Telephone Encounter (Signed)
Sounds good to me.  We can start him in therapy after his first appointment on the 31st.  Please just have [facility] send him the op note as well as some recommendations on a prescription pad.  Thanks

## 2023-04-23 NOTE — Telephone Encounter (Signed)
Holding as reminder when patient sees Franky Macho on 05/31

## 2023-04-24 ENCOUNTER — Other Ambulatory Visit: Payer: Self-pay

## 2023-04-25 ENCOUNTER — Telehealth: Payer: Self-pay | Admitting: Surgical

## 2023-04-25 ENCOUNTER — Ambulatory Visit (INDEPENDENT_AMBULATORY_CARE_PROVIDER_SITE_OTHER): Payer: BC Managed Care – PPO | Admitting: Surgical

## 2023-04-25 DIAGNOSIS — M75111 Incomplete rotator cuff tear or rupture of right shoulder, not specified as traumatic: Secondary | ICD-10-CM

## 2023-04-25 MED ORDER — HYDROMORPHONE HCL 2 MG PO TABS: 2.0000 mg | ORAL_TABLET | ORAL | 0 refills | Status: DC | PRN

## 2023-04-25 NOTE — Telephone Encounter (Signed)
Patient called. Would like to know if he can drive, cb# is [phone]

## 2023-04-25 NOTE — Progress Notes (Signed)
   Post-Op Visit Note   Patient: James FOUCAULT Sr.           Date of Birth: Jun 09, 1951           MRN: [id number] Visit Date: 04/25/2023 [facility]: [facility]   Assessment & Plan:  Chief Complaint: No chief complaint on file.  Visit Diagnoses: No diagnosis found.  Plan: James [name]. is a 72 y.o. male who presents s/p *** shoulder rotator cuff repair and biceps tenodesis on ***.  Patient is doing well and pain is overall controlled.  Up to ***degrees on CPM machine.  Denies any chest pain, SOB, fevers, chills. Taking pain medication every *** hours.    On exam, patient has range of motion ***.  Intact EPL, FPL, finger abduction, finger adduction, pronation/supination, bicep, tricep, deltoid of operative extremity.  Axillary nerve intact with deltoid firing.  Incisions are healing well without evidence of infection or dehiscence.  Sutures removed and replaced with Steri-Strips today.  2+ radial pulse of the operative extremity  Plan is ***.   Follow-Up Instructions: No follow-ups on file.   Orders:  No orders of the defined types were placed in this encounter.  No orders of the defined types were placed in this encounter.   Imaging: No results found.  PMFS History: Patient Active Problem List   Diagnosis Date Noted   Hypertension 02/22/2011   Diabetes mellitus type II 02/22/2011   Anemia, unspecified 02/22/2011   Enteritis 02/22/2011   Past Medical History:  Diagnosis Date   Arthritis    Diabetes mellitus    GERD (gastroesophageal reflux disease)    HLD (hyperlipidemia)    Hydrocele    Hypertension     Family History  Problem Relation Age of Onset   Brain cancer Brother        deceased age 12   Lung cancer Brother        deceased age 48   Diabetes Mother    Heart disease Mother        CHF, deceased 22   Diabetes Father    Kidney failure Father        deceased 25   Colon cancer Neg Hx     Past Surgical History:  Procedure  Laterality Date   APPENDECTOMY     As a teenager   COLONOSCOPY  2023   HEMORRHOID SURGERY     LASIK Bilateral    SHOULDER ARTHROSCOPY WITH SUBACROMIAL DECOMPRESSION AND BICEP TENDON REPAIR Right 04/18/2023   Procedure: RIGHT SHOULDER ARTHROSCOPY, DEBRIDEMENT, SUBACROMIAL DECOMPRESSION, BICEPS TENODESIS;  Surgeon: Cammy Copa, MD;  Location: [facility];  Service: Orthopedics;  Laterality: Right;   TONSILLECTOMY     As a child   Social History   Occupational History   Occupation: TRANSPORTATION    Employer: [employer]  Tobacco Use   Smoking status: Never   Smokeless tobacco: Never  Substance and Sexual Activity   Alcohol use: No    Alcohol/week: 0.0 standard drinks of alcohol   Drug use: No   Sexual activity: Yes

## 2023-04-26 ENCOUNTER — Encounter: Payer: Self-pay | Admitting: Surgical

## 2023-04-27 DIAGNOSIS — M7551 Bursitis of right shoulder: Secondary | ICD-10-CM

## 2023-04-27 DIAGNOSIS — M7521 Bicipital tendinitis, right shoulder: Secondary | ICD-10-CM

## 2023-04-27 DIAGNOSIS — S43431A Superior glenoid labrum lesion of right shoulder, initial encounter: Secondary | ICD-10-CM

## 2023-04-30 NOTE — Telephone Encounter (Signed)
I think he is good for driving but I would definitely avoid driving while taking the pain medication.  I would also spend the first several weeks just driving short distances and avoid any highways or any busy roads like [address] or [address].  If he is still taking any pain medication, definitely should not drive.

## 2023-05-01 NOTE — Telephone Encounter (Signed)
Called and advised pt.

## 2023-05-06 ENCOUNTER — Telehealth: Payer: Self-pay | Admitting: Orthopedic Surgery

## 2023-05-06 NOTE — Telephone Encounter (Signed)
Received call from patient. He needs notes from date of surgery to present sent to [facility] for his STD. I faxed [phone]/claim number [id number]

## 2023-05-16 ENCOUNTER — Ambulatory Visit (INDEPENDENT_AMBULATORY_CARE_PROVIDER_SITE_OTHER): Payer: BC Managed Care – PPO | Admitting: Orthopedic Surgery

## 2023-05-16 ENCOUNTER — Encounter: Payer: BC Managed Care – PPO | Admitting: Orthopedic Surgery

## 2023-05-16 DIAGNOSIS — M7521 Bicipital tendinitis, right shoulder: Secondary | ICD-10-CM

## 2023-05-16 MED ORDER — TRAMADOL HCL 50 MG PO TABS: ORAL_TABLET | ORAL | 0 refills | Status: AC

## 2023-05-16 MED ORDER — HYDROMORPHONE HCL 2 MG PO TABS: ORAL_TABLET | ORAL | 0 refills | Status: AC

## 2023-05-16 NOTE — Progress Notes (Unsigned)
Post-Op Visit Note   Patient: James STEFFENHAGEN Sr.           Date of Birth: 07-15-1951           MRN: [id number] Visit Date: 05/16/2023 PCP: [facility]   Assessment & Plan:  Chief Complaint:  Chief Complaint  Patient presents with   Right Shoulder - Routine Post Op     right shoulder rotator cuff repair and biceps tenodesis on 04/18/2023.   Visit Diagnoses:  1. Biceps tendinitis of right shoulder     Plan: James [name] is a patient who underwent right shoulder biceps tenodesis on 04/18/2023.  Taking Dilaudid for pain.  CPM at 120.  On examination he has good range of motion of the shoulder.  Biceps contour normal.  At this time want him to discontinue the sling and continue with a self-directed program for shoulder range of motion.  No overt biceps resistive lifting for 2 weeks then activity as tolerated.  Come back in 4 weeks for final check.  Need to taper Dilaudid is discussed and we will do that over 10-day.  And then transition to Ultram.  Follow-Up Instructions: No follow-ups on file.   Orders:  No orders of the defined types were placed in this encounter.  Meds ordered this encounter  Medications   HYDROmorphone (DILAUDID) 2 MG tablet    Sig: 1 po bid prn x 5 days then q d x 5 days    Dispense:  15 tablet    Refill:  0   traMADol (ULTRAM) 50 MG tablet    Sig: 1 po q 8 hrs prn pain to start on 05/26/23    Dispense:  30 tablet    Refill:  0    Imaging: No results found.  PMFS History: Patient Active Problem List   Diagnosis Date Noted   Biceps tendonitis, right 04/27/2023   Degenerative superior labral anterior-to-posterior (SLAP) tear of right shoulder 04/27/2023   Bursitis of right shoulder 04/27/2023   Hypertension 02/22/2011   Diabetes mellitus type II 02/22/2011   Anemia, unspecified 02/22/2011   Enteritis 02/22/2011   Past Medical History:  Diagnosis Date   Arthritis    Diabetes mellitus    GERD (gastroesophageal reflux disease)     HLD (hyperlipidemia)    Hydrocele    Hypertension     Family History  Problem Relation Age of Onset   Brain cancer Brother        deceased age 47   Lung cancer Brother        deceased age 68   Diabetes Mother    Heart disease Mother        CHF, deceased 21   Diabetes Father    Kidney failure Father        deceased 46   Colon cancer Neg Hx     Past Surgical History:  Procedure Laterality Date   APPENDECTOMY     As a teenager   COLONOSCOPY  2023   HEMORRHOID SURGERY     LASIK Bilateral    SHOULDER ARTHROSCOPY WITH SUBACROMIAL DECOMPRESSION AND BICEP TENDON REPAIR Right 04/18/2023   Procedure: RIGHT SHOULDER ARTHROSCOPY, DEBRIDEMENT, SUBACROMIAL DECOMPRESSION, BICEPS TENODESIS;  Surgeon: Cammy Copa, MD;  Location: [facility];  Service: Orthopedics;  Laterality: Right;   TONSILLECTOMY     As a child   Social History   Occupational History   Occupation: TRANSPORTATION    Employer: [employer]  Tobacco Use   Smoking  status: Never   Smokeless tobacco: Never  Substance and Sexual Activity   Alcohol use: No    Alcohol/week: 0.0 standard drinks of alcohol   Drug use: No   Sexual activity: Yes

## 2023-05-17 ENCOUNTER — Encounter: Payer: Self-pay | Admitting: Orthopedic Surgery

## 2023-05-30 ENCOUNTER — Telehealth: Payer: Self-pay | Admitting: Orthopedic Surgery

## 2023-05-30 NOTE — Telephone Encounter (Signed)
Standard forms received. To [employer]. 

## 2023-06-03 ENCOUNTER — Telehealth: Payer: Self-pay | Admitting: Orthopedic Surgery

## 2023-06-03 NOTE — Telephone Encounter (Signed)
Note completed 

## 2023-06-03 NOTE — Telephone Encounter (Signed)
Called and advised pt.

## 2023-06-03 NOTE — Telephone Encounter (Signed)
Ok for this only had biceps tenodesis thx no restriction

## 2023-06-03 NOTE — Telephone Encounter (Signed)
Ok for this note? right shoulder rotator cuff repair and biceps tenodesis on 04/18/2023.

## 2023-06-03 NOTE — Telephone Encounter (Signed)
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  Patient advised he will pick up the note. Patient also said the note need to be written return without restrictions.   The number to contact patient is [phone]

## 2023-06-13 ENCOUNTER — Ambulatory Visit (INDEPENDENT_AMBULATORY_CARE_PROVIDER_SITE_OTHER): Payer: BC Managed Care – PPO | Admitting: Orthopedic Surgery

## 2023-06-13 ENCOUNTER — Encounter: Payer: Self-pay | Admitting: Orthopedic Surgery

## 2023-06-13 DIAGNOSIS — M7521 Bicipital tendinitis, right shoulder: Secondary | ICD-10-CM

## 2023-06-13 NOTE — Progress Notes (Signed)
Post-Op Visit Note   Patient: James VANCUREN Sr.           Date of Birth: Dec 27, 1950           MRN: [id number] Visit Date: 06/13/2023 [facility]: [name]   Assessment & Plan:  Chief Complaint:  Chief Complaint  Patient presents with   Right Shoulder - Follow-up    right shoulder rotator cuff repair and biceps tenodesis on 04/18/2023.   Visit Diagnoses:  1. Biceps tendinitis of right shoulder     Plan: James [name] is now about 8 weeks out right shoulder biceps tenodesis.  Doing well.  Not doing any therapy.  He drives a truck.  Does not really have to do too much heavy physical work per his report regarding that work.  He is off Dilaudid and taking tramadol as needed.  On examination he has good bicep strength and close to normal biceps contour symmetric with the other side.  Range of motion on the right is about 45/85/140.  Rotator cuff strength is good.  Plan at this time is I want him to be careful with lifting out away from his body.  I think the biceps tendon has locked in but I do want him to be careful with overexertion which could create rotator cuff problems.  Rotator cuff had a little wear and tear at the time of surgery and arthroscopy but overall was intact.  He will follow-up with [place] as needed.  Follow-Up Instructions: No follow-ups on file.   Orders:  No orders of the defined types were placed in this encounter.  No orders of the defined types were placed in this encounter.   Imaging: No results found.  PMFS History: Patient Active Problem List   Diagnosis Date Noted   Biceps tendonitis, right 04/27/2023   Degenerative superior labral anterior-to-posterior (SLAP) tear of right shoulder 04/27/2023   Bursitis of right shoulder 04/27/2023   Hypertension 02/22/2011   Diabetes mellitus type II 02/22/2011   Anemia, unspecified 02/22/2011   Enteritis 02/22/2011   Past Medical History:  Diagnosis Date   Arthritis    Diabetes mellitus    GERD  (gastroesophageal reflux disease)    HLD (hyperlipidemia)    Hydrocele    Hypertension     Family History  Problem Relation Age of Onset   Brain cancer Brother        deceased age 55   Lung cancer Brother        deceased age 58   Diabetes Mother    Heart disease Mother        CHF, deceased 52   Diabetes Father    Kidney failure Father        deceased 85   Colon cancer Neg Hx     Past Surgical History:  Procedure Laterality Date   APPENDECTOMY     As a teenager   COLONOSCOPY  2023   HEMORRHOID SURGERY     LASIK Bilateral    SHOULDER ARTHROSCOPY WITH SUBACROMIAL DECOMPRESSION AND BICEP TENDON REPAIR Right 04/18/2023   Procedure: RIGHT SHOULDER ARTHROSCOPY, DEBRIDEMENT, SUBACROMIAL DECOMPRESSION, BICEPS TENODESIS;  Surgeon: Cammy Copa, MD;  Location: [facility];  Service: Orthopedics;  Laterality: Right;   TONSILLECTOMY     As a child   Social History   Occupational History   Occupation: TRANSPORTATION    Employer: [employer]  Tobacco Use   Smoking status: Never   Smokeless tobacco: Never  Substance and Sexual Activity  Alcohol use: No    Alcohol/week: 0.0 standard drinks of alcohol   Drug use: No   Sexual activity: Yes
